# Patient Record
Sex: Female | Born: 1975 | Race: White | Hispanic: No | Smoking: Never smoker
Health system: Southern US, Community
[De-identification: ages and names within clinical notes are randomized; demographics above are authoritative.]

## PROBLEM LIST (undated history)

## (undated) DIAGNOSIS — F32A Depression, unspecified: Secondary | ICD-10-CM

## (undated) DIAGNOSIS — F419 Anxiety disorder, unspecified: Secondary | ICD-10-CM

## (undated) DIAGNOSIS — Z90710 Acquired absence of both cervix and uterus: Secondary | ICD-10-CM

## (undated) DIAGNOSIS — R9389 Abnormal findings on diagnostic imaging of other specified body structures: Secondary | ICD-10-CM

## (undated) DIAGNOSIS — R5383 Other fatigue: Secondary | ICD-10-CM

## (undated) DIAGNOSIS — E669 Obesity, unspecified: Secondary | ICD-10-CM

## (undated) DIAGNOSIS — N939 Abnormal uterine and vaginal bleeding, unspecified: Secondary | ICD-10-CM

## (undated) DIAGNOSIS — G4700 Insomnia, unspecified: Secondary | ICD-10-CM

## (undated) DIAGNOSIS — F909 Attention-deficit hyperactivity disorder, unspecified type: Secondary | ICD-10-CM

## (undated) DIAGNOSIS — Z9189 Other specified personal risk factors, not elsewhere classified: Secondary | ICD-10-CM

## (undated) DIAGNOSIS — I10 Essential (primary) hypertension: Secondary | ICD-10-CM

## (undated) DIAGNOSIS — K760 Fatty (change of) liver, not elsewhere classified: Secondary | ICD-10-CM

## (undated) DIAGNOSIS — E119 Type 2 diabetes mellitus without complications: Secondary | ICD-10-CM

## (undated) DIAGNOSIS — U071 COVID-19: Secondary | ICD-10-CM

## (undated) DIAGNOSIS — R7989 Other specified abnormal findings of blood chemistry: Secondary | ICD-10-CM

## (undated) DIAGNOSIS — F3289 Other specified depressive episodes: Secondary | ICD-10-CM

## (undated) DIAGNOSIS — N8500 Endometrial hyperplasia, unspecified: Secondary | ICD-10-CM

## (undated) DIAGNOSIS — Z6841 Body Mass Index (BMI) 40.0 and over, adult: Secondary | ICD-10-CM

## (undated) DIAGNOSIS — T7431XA Adult psychological abuse, confirmed, initial encounter: Secondary | ICD-10-CM

## (undated) HISTORY — DX: Endometrial hyperplasia, unspecified: N85.00

## (undated) HISTORY — DX: Type 2 diabetes mellitus without complications: E11.9

## (undated) HISTORY — DX: Abnormal findings on diagnostic imaging of other specified body structures: R93.89

## (undated) HISTORY — DX: Depression, unspecified: F32.A

## (undated) HISTORY — DX: Anxiety disorder, unspecified: F41.9

## (undated) HISTORY — DX: Insomnia, unspecified: G47.00

## (undated) HISTORY — DX: Other specified depressive episodes: F32.89

## (undated) HISTORY — DX: Acquired absence of both cervix and uterus: Z90.710

## (undated) HISTORY — DX: Other fatigue: R53.83

## (undated) HISTORY — DX: Attention-deficit hyperactivity disorder, unspecified type: F90.9

## (undated) HISTORY — DX: Fatty (change of) liver, not elsewhere classified: K76.0

## (undated) HISTORY — DX: COVID-19: U07.1

## (undated) HISTORY — DX: Other specified abnormal findings of blood chemistry: R79.89

## (undated) HISTORY — DX: Other specified personal risk factors, not elsewhere classified: Z91.89

## (undated) HISTORY — PX: LAPAROTOMY: SHX154

## (undated) HISTORY — DX: Adult psychological abuse, confirmed, initial encounter: T74.31XA

## (undated) HISTORY — DX: Essential (primary) hypertension: I10

## (undated) HISTORY — DX: Abnormal uterine and vaginal bleeding, unspecified: N93.9

## (undated) HISTORY — DX: Body Mass Index (BMI) 40.0 and over, adult: Z684

## (undated) HISTORY — DX: Obesity, unspecified: E66.9

---

## 2018-04-07 ENCOUNTER — Other Ambulatory Visit: Payer: Self-pay | Admitting: Licensed Clinical Social Worker

## 2018-04-10 ENCOUNTER — Other Ambulatory Visit: Payer: Self-pay | Admitting: Licensed Clinical Social Worker

## 2018-04-25 ENCOUNTER — Ambulatory Visit: Payer: Self-pay | Admitting: Primary Care

## 2018-05-08 ENCOUNTER — Encounter: Payer: Self-pay | Admitting: Family Medicine

## 2018-05-08 NOTE — Progress Notes (Signed)
Patient did not keep appointment today. She may call to reschedule.  

## 2019-05-30 ENCOUNTER — Other Ambulatory Visit: Payer: Self-pay

## 2019-05-30 DIAGNOSIS — Z20822 Contact with and (suspected) exposure to covid-19: Secondary | ICD-10-CM

## 2019-05-31 LAB — NOVEL CORONAVIRUS, NAA: SARS-CoV-2, NAA: NOT DETECTED

## 2019-06-01 ENCOUNTER — Other Ambulatory Visit: Payer: Self-pay

## 2019-06-01 ENCOUNTER — Ambulatory Visit (HOSPITAL_COMMUNITY)
Admission: EM | Admit: 2019-06-01 | Discharge: 2019-06-01 | Disposition: A | Discharge status: Home / Self Care | Payer: 59 | Attending: Family Medicine | Admitting: Family Medicine

## 2019-06-01 ENCOUNTER — Encounter (HOSPITAL_COMMUNITY): Payer: Self-pay

## 2019-06-01 DIAGNOSIS — J069 Acute upper respiratory infection, unspecified: Secondary | ICD-10-CM | POA: Diagnosis not present

## 2019-06-01 DIAGNOSIS — Z20828 Contact with and (suspected) exposure to other viral communicable diseases: Secondary | ICD-10-CM | POA: Insufficient documentation

## 2019-06-01 DIAGNOSIS — Z20822 Contact with and (suspected) exposure to covid-19: Secondary | ICD-10-CM

## 2019-06-01 MED ORDER — ONDANSETRON 4 MG PO TBDP: 4.0000 mg | ORAL_TABLET | Freq: Three times a day (TID) | ORAL | 0 refills | Status: DC | PRN

## 2019-06-01 MED ORDER — ALBUTEROL SULFATE HFA 108 (90 BASE) MCG/ACT IN AERS: 1.0000 | INHALATION_SPRAY | Freq: Four times a day (QID) | RESPIRATORY_TRACT | 0 refills | Status: DC | PRN

## 2019-06-01 NOTE — ED Triage Notes (Addendum)
Pt reports she was exposed 1 week ago to [employer]. Pt was tested for Covid 2 days ago and the negative result came back last night. Pt states having low grade fever x 3 days: nasal congestion, cough, sore throat, chills and nausea x 4 days.  Pt reports headache on and off x 4 days. Pt reports she had diarrhea 2 days ago.

## 2019-06-01 NOTE — ED Provider Notes (Signed)
[facility]    CSN: [id number] Arrival date & time: 06/01/19  0263      History   Chief Complaint Chief Complaint  Patient presents with  . Nasal Congestion  . Fever  . Sore Throat  . Cough  . Chills  . Headache  . Nausea    HPI Gloria Hardy is a 43 y.o. female.   Gloria Hardy presents with complaints of nausea, diarrhea, headache, some shortness of breath.  A coworker was ill 10/31 and developed a fever. 11/2 this coworker tested positive for covid-19. Started to feel unwell herself on 11/2 and was tested herself for covid-19. Results yesterday were negative. No asthma history. Uses a peppermint oil inhaler which does help. Nasal drainage, cough. Some muscle aches, such as to back, shoulders and neck. Some symptoms have improved, but some have worsened. No further diarrhea but stomach doesn't feel well. Nothing has helped the headache, although it has waxed and waned. Temp yesterday of 100.7. temp of 100.1 this morning.  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 Nausea, no vomiting. Eating and drinking but decreased intake. Decreased taste? No loss of smell. No other medications for symptoms. Chest tight, but no chest pain or difficulty breathing. Cough is dry.     ROS per HPI, negative if not otherwise mentioned.      History reviewed. No pertinent past medical history.  There are no active problems to display for this patient.   History reviewed. No pertinent surgical history.  OB History    Gravida  5   Para  3   Term  2   Preterm  1   AB  2   Living  3     SAB      TAB      Ectopic      Multiple      Live Births               Home Medications    Prior to Admission medications   Medication Sig Start Date End Date Taking? Authorizing Provider  Biotin 1000 MCG tablet Take 1,000 mcg by mouth 3 (three) times daily.   Yes [provider]  magnesium 30 MG tablet Take 30 mg by mouth 2 (two) times daily.   Yes [provider]  Multiple Vitamin (MULTIVITAMIN) tablet Take by mouth daily.   Yes [provider]  albuterol (PROAIR HFA) 108 (90 Base) MCG/ACT inhaler Inhale 1-2 puffs into the lungs every 6 (six) hours as needed for wheezing or shortness of breath. 06/01/19   Georgetta Haber, NP  ondansetron (ZOFRAN-ODT) 4 MG disintegrating tablet Take 1 tablet (4 mg total) by mouth every 8 (eight) hours as needed for nausea or vomiting. 06/01/19   Georgetta Haber, NP    Family History Family History  Problem Relation Age of Onset  . Healthy Mother   . Healthy Father     Social History Social History   Tobacco Use  . Smoking status: Never Smoker  . Smokeless tobacco: Never Used  Substance Use Topics  . Alcohol use: Yes  . Drug use: Never     Allergies   Patient has no known allergies.   Review of Systems Review of Systems   Physical Exam Triage Vital Signs ED Triage Vitals  Enc Vitals Group     BP 06/01/19 0955 140/80     Pulse Rate 06/01/19 0955 (!) 104     Resp 06/01/19 0955 18  Temp 06/01/19 0955 98.4 F (36.9 C)     Temp Source 06/01/19 0955 Oral     SpO2 06/01/19 0955 99 %     Weight --      Height --      Head Circumference --      Peak Flow --      Pain Score 06/01/19 0951 3     Pain Loc --      Pain Edu? --      Excl. in [place]? --    No data found.  Updated Vital Signs BP 140/80 (BP Location: Right Arm)   Pulse (!) 104   Temp 98.4 F (36.9 C) (Oral)   Resp 18   LMP 05/27/2019 (Exact Date)   SpO2 99%    Physical Exam Constitutional:      General: She is not in acute distress.    Appearance: She is well-developed.  Cardiovascular:     Rate and Rhythm: Tachycardia present.  Pulmonary:     Effort: Pulmonary effort is normal.  Skin:    General: Skin is warm and dry.  Neurological:     Mental Status: She is alert and oriented to person, place, and time.      UC Treatments / Results  Labs (all labs ordered are listed, but only abnormal  results are displayed) Labs Reviewed  NOVEL CORONAVIRUS, NAA (HOSP ORDER, SEND-OUT TO REF LAB; TAT 18-24 HRS)    EKG   Radiology No results found.  Procedures Procedures (including critical care time)  Medications Ordered in UC Medications - No data to display  Initial Impression / Assessment and Plan / UC Course  I have reviewed the triage vital signs and the nursing notes.  Pertinent labs & imaging results that were available during my care of the patient were reviewed by me and considered in my medical decision making (see chart for details).     Non toxic. Benign physical exam.  No increased work of breathing. No hypoxia. Mild tachycardia. Known covid exposure with symptoms consistent with the virus. High suspicion at this time, with possible false negative testing. Opted to repeat testing at this time with isolation precautions discussed and recommended at this time. Supportive cares recommended and discussed. Return precautions provided. Patient verbalized understanding and agreeable to plan.   Final Clinical Impressions(s) / UC Diagnoses   Final diagnoses:  Exposure to COVID-19 virus  Viral URI with cough     Discharge Instructions     Push fluids to ensure adequate hydration and keep secretions thin.  Tylenol and/or ibuprofen as needed for pain or fevers.  May use inhaler or zofran as needed for symptoms.  Self isolate until covid results are back and negative.  Will notify you by phone of any positive findings. Your negative results will be sent through your MyChart.     You may try some vitamins to help your immune system potentially:  Vitamin C 500mg  twice a day. Zing 50mg  daily. Vitamin D 5000IU daily.      ED Prescriptions    Medication Sig Dispense Auth. Provider   albuterol (PROAIR HFA) 108 (90 Base) MCG/ACT inhaler Inhale 1-2 puffs into the lungs every 6 (six) hours as needed for wheezing or shortness of breath. 8 g Augusto Gamble B, NP   ondansetron  (ZOFRAN-ODT) 4 MG disintegrating tablet Take 1 tablet (4 mg total) by mouth every 8 (eight) hours as needed for nausea or vomiting. 12 tablet Zigmund Gottron, NP  PDMP not reviewed this encounter.   Georgetta HaberBurky,  B, NP 06/01/19 1048

## 2019-06-01 NOTE — Discharge Instructions (Signed)
Push fluids to ensure adequate hydration and keep secretions thin.  Tylenol and/or ibuprofen as needed for pain or fevers.  May use inhaler or zofran as needed for symptoms.  Self isolate until covid results are back and negative.  Will notify you by phone of any positive findings. Your negative results will be sent through your MyChart.     You may try some vitamins to help your immune system potentially:  Vitamin C 500mg  twice a day. Zing 50mg  daily. Vitamin D 5000IU daily.

## 2019-06-03 LAB — NOVEL CORONAVIRUS, NAA (HOSP ORDER, SEND-OUT TO REF LAB; TAT 18-24 HRS): SARS-CoV-2, NAA: NOT DETECTED

## 2019-11-29 ENCOUNTER — Other Ambulatory Visit: Payer: Self-pay

## 2019-11-29 ENCOUNTER — Encounter (HOSPITAL_COMMUNITY): Payer: Self-pay

## 2019-11-29 ENCOUNTER — Ambulatory Visit (HOSPITAL_COMMUNITY)
Admission: EM | Admit: 2019-11-29 | Discharge: 2019-11-29 | Disposition: A | Discharge status: Home / Self Care | Payer: Managed Care, Other (non HMO) | Attending: Emergency Medicine | Admitting: Emergency Medicine

## 2019-11-29 DIAGNOSIS — H538 Other visual disturbances: Secondary | ICD-10-CM | POA: Diagnosis not present

## 2019-11-29 DIAGNOSIS — I10 Essential (primary) hypertension: Secondary | ICD-10-CM | POA: Diagnosis not present

## 2019-11-29 DIAGNOSIS — H00011 Hordeolum externum right upper eyelid: Secondary | ICD-10-CM | POA: Insufficient documentation

## 2019-11-29 LAB — POCT URINALYSIS DIP (DEVICE)
Bilirubin Urine: NEGATIVE
Glucose, UA: NEGATIVE mg/dL
Ketones, ur: NEGATIVE mg/dL
Leukocytes,Ua: NEGATIVE
Nitrite: NEGATIVE
Protein, ur: NEGATIVE mg/dL
Specific Gravity, Urine: >=1.03 (ref 1.005–1.030)
Urobilinogen, UA: 0.2 mg/dL (ref 0.0–1.0)
pH: 5.5 (ref 5.0–8.0)

## 2019-11-29 LAB — CBG MONITORING, ED: Glucose-Capillary: 88 mg/dL (ref 70–99)

## 2019-11-29 MED ORDER — AMLODIPINE BESYLATE 5 MG PO TABS
5.0000 mg | ORAL_TABLET | Freq: Every day | ORAL | 1 refills | Status: DC
Start: 2019-11-29 — End: 2020-01-18

## 2019-11-29 MED ORDER — CEPHALEXIN 500 MG PO CAPS
500.0000 mg | ORAL_CAPSULE | Freq: Four times a day (QID) | ORAL | 0 refills | Status: DC
Start: 2019-11-29 — End: 2020-01-18

## 2019-11-29 NOTE — Discharge Instructions (Addendum)
You will need to follow up with a pcp for new onset of hypertension.  Your blood pressure is high you will need to monitor daily the medication can cause you to become tired at times.  If you begin to have chest pain, shortness of breath you will need to go to the ER .  Stay hydrated with clear fluids  Cautious with going from sitting to standing  You may need to follow up with optmology if the stye continues

## 2019-11-29 NOTE — ED Provider Notes (Signed)
[place]    CSN: [id number] Arrival date & time: 11/29/19  1655      History   Chief Complaint Chief Complaint  Patient presents with  . Blurred Vision    HPI Gloria Hardy is a 44 y.o. female.   Pt is here for blurred vision intermit for the past few days worse while in work. States that she has also had a Stye to bil eye lids for approx 1 month. States that she has been taking advil cold and sinus intermit last one yesterday thinking her eyes was irritated from allergies with no relief. Denies any headache, no chest pain, no sob. Alert x4. Does have frequency with no urinary sx. Asked pt about hx of diabetes states that she did have gestational dm. Does not have a pcp at this time , has not seen one in a few years.      History reviewed. No pertinent past medical history.  There are no problems to display for this patient.   History reviewed. No pertinent surgical history.  OB History    Gravida  5   Para  3   Term  2   Preterm  1   AB  2   Living  3     SAB      TAB      Ectopic      Multiple      Live Births               Home Medications    Prior to Admission medications   Medication Sig Start Date End Date Taking? Authorizing Provider  albuterol (PROAIR HFA) 108 (90 Base) MCG/ACT inhaler Inhale 1-2 puffs into the lungs every 6 (six) hours as needed for wheezing or shortness of breath. 06/01/19   Zigmund Gottron, NP  amLODipine (NORVASC) 5 MG tablet Take 1 tablet (5 mg total) by mouth daily. 11/29/19   Marney Setting, NP  Biotin 1000 MCG tablet Take 1,000 mcg by mouth 3 (three) times daily.    [provider]  cephALEXin (KEFLEX) 500 MG capsule Take 1 capsule (500 mg total) by mouth 4 (four) times daily. 11/29/19   Marney Setting, NP  magnesium 30 MG tablet Take 30 mg by mouth 2 (two) times daily.    [provider]  Multiple Vitamin (MULTIVITAMIN) tablet Take by mouth daily.    [provider]   ondansetron (ZOFRAN-ODT) 4 MG disintegrating tablet Take 1 tablet (4 mg total) by mouth every 8 (eight) hours as needed for nausea or vomiting. 06/01/19   Zigmund Gottron, NP    Family History Family History  Problem Relation Age of Onset  . Healthy Mother   . Healthy Father     Social History Social History   Tobacco Use  . Smoking status: Never Smoker  . Smokeless tobacco: Never Used  Substance Use Topics  . Alcohol use: Yes  . Drug use: Never     Allergies   Patient has no known allergies.   Review of Systems Review of Systems  Constitutional: Negative.   HENT: Negative.   Eyes: Positive for visual disturbance.  Respiratory: Negative.   Cardiovascular: Negative.   Gastrointestinal: Negative.   Genitourinary: Positive for frequency.  Neurological: Negative.      Physical Exam Triage Vital Signs ED Triage Vitals  Enc Vitals Group     BP 11/29/19 1714 (!) 153/103     Pulse Rate 11/29/19 1714 98  Resp 11/29/19 1714 19     Temp 11/29/19 1714 98.9 F (37.2 C)     Temp Source 11/29/19 1714 Oral     SpO2 11/29/19 1714 100 %     Weight 11/29/19 1715 300 lb (136.1 kg)     Height --      Head Circumference --      Peak Flow --      Pain Score 11/29/19 1713 0     Pain Loc --      Pain Edu? --      Excl. in [place]? --    Orthostatic VS for the past 24 hrs:  BP- Lying Pulse- Lying BP- Sitting Pulse- Sitting BP- Standing at 0 minutes Pulse- Standing at 0 minutes  11/29/19 1756 (!) 152/92 95 (!) 162/113 101 (!) 169/127 101    Updated Vital Signs BP (!) 153/103 (BP Location: Right Arm)   Pulse 98   Temp 98.9 F (37.2 C) (Oral)   Resp 19   Wt 300 lb (136.1 kg)   LMP 11/23/2019   SpO2 100%   Visual Acuity Right Eye Distance: 20/30 Left Eye Distance: 20/40 Bilateral Distance: 20/30  Right Eye Near:   Left Eye Near:    Bilateral Near:     Physical Exam Constitutional:      Appearance: She is obese.  HENT:     Right Ear: Tympanic membrane normal.      Left Ear: Tympanic membrane normal.     Nose: Nose normal.     Mouth/Throat:     Mouth: Mucous membranes are moist.  Eyes:     Pupils: Pupils are equal, round, and reactive to light.     Comments: On bil upper eye lids small area of harden hordeolum noted non painful, slight erythema ,   Cardiovascular:     Rate and Rhythm: Normal rate.  Pulmonary:     Effort: Pulmonary effort is normal.  Abdominal:     General: Abdomen is flat.  Musculoskeletal:     Cervical back: Normal range of motion.  Skin:    General: Skin is warm.     Capillary Refill: Capillary refill takes less than 2 seconds.  Neurological:     General: No focal deficit present.     Mental Status: She is alert.      UC Treatments / Results  Labs (all labs ordered are listed, but only abnormal results are displayed) Labs Reviewed  POCT URINALYSIS DIP (DEVICE) - Abnormal; Notable for the following components:      Result Value   Hgb urine dipstick TRACE (*)    All other components within normal limits  URINE CULTURE  CBG MONITORING, ED    EKG   Radiology No results found.  Procedures Procedures (including critical care time)  Medications Ordered in UC Medications - No data to display  Initial Impression / Assessment and Plan / UC Course  I have reviewed the triage vital signs and the nursing notes.  Pertinent labs & imaging results that were available during my care of the patient were reviewed by me and considered in my medical decision making (see chart for details).     You will need to follow up with a pcp for new onset of hypertension.  Your blood pressure is high you will need to monitor daily the medication can cause you to become tired at times.  If you begin to have chest pain, shortness of breath you will need to go to the ER .  Stay hydrated with clear fluids avoid salt  Cautious with going from sitting to standing    Final Clinical Impressions(s) / UC Diagnoses   Final diagnoses:   Essential hypertension  Blurred vision  Hordeolum externum of right upper eyelid     Discharge Instructions     You will need to follow up with a pcp for new onset of hypertension.  Your blood pressure is high you will need to monitor daily the medication can cause you to become tired at times.  If you begin to have chest pain, shortness of breath you will need to go to the ER .  Stay hydrated with clear fluids  Cautious with going from sitting to standing  You may need to follow up with optmology if the stye continues      ED Prescriptions    Medication Sig Dispense Auth. Provider   amLODipine (NORVASC) 5 MG tablet Take 1 tablet (5 mg total) by mouth daily. 30 tablet Maple Mirza L, NP   cephALEXin (KEFLEX) 500 MG capsule Take 1 capsule (500 mg total) by mouth 4 (four) times daily. 20 capsule Coralyn Mark, NP     PDMP not reviewed this encounter.   Coralyn Mark, NP 11/29/19 1839

## 2019-11-29 NOTE — ED Notes (Signed)
Pt reported mild dizziness 2/10 scale subjectively from lying to sitting position during ortho v/s.

## 2019-11-29 NOTE — ED Notes (Signed)
CBG completed 88; last po meal was approx 3 hours PTA

## 2019-11-29 NOTE — ED Triage Notes (Signed)
Pt is here with blurred vision and watery eyes that started yesterday. Pt has not taken to relieve discomfort.

## 2019-11-30 LAB — URINE CULTURE: Culture: 10000 — AB

## 2020-01-18 ENCOUNTER — Other Ambulatory Visit: Payer: Self-pay

## 2020-01-18 ENCOUNTER — Encounter (HOSPITAL_COMMUNITY): Payer: Self-pay | Admitting: Emergency Medicine

## 2020-01-18 ENCOUNTER — Ambulatory Visit (HOSPITAL_COMMUNITY)
Admission: EM | Admit: 2020-01-18 | Discharge: 2020-01-18 | Disposition: A | Discharge status: Home / Self Care | Payer: 59 | Attending: Internal Medicine | Admitting: Internal Medicine

## 2020-01-18 DIAGNOSIS — I10 Essential (primary) hypertension: Secondary | ICD-10-CM | POA: Diagnosis not present

## 2020-01-18 DIAGNOSIS — Z791 Long term (current) use of non-steroidal anti-inflammatories (NSAID): Secondary | ICD-10-CM | POA: Insufficient documentation

## 2020-01-18 DIAGNOSIS — R519 Headache, unspecified: Secondary | ICD-10-CM

## 2020-01-18 DIAGNOSIS — Z79899 Other long term (current) drug therapy: Secondary | ICD-10-CM | POA: Insufficient documentation

## 2020-01-18 DIAGNOSIS — R110 Nausea: Secondary | ICD-10-CM | POA: Insufficient documentation

## 2020-01-18 DIAGNOSIS — B349 Viral infection, unspecified: Secondary | ICD-10-CM

## 2020-01-18 DIAGNOSIS — Z20822 Contact with and (suspected) exposure to covid-19: Secondary | ICD-10-CM | POA: Diagnosis not present

## 2020-01-18 HISTORY — DX: Essential (primary) hypertension: I10

## 2020-01-18 LAB — CBG MONITORING, ED: Glucose-Capillary: 87 mg/dL (ref 70–99)

## 2020-01-18 MED ORDER — NAPROXEN 500 MG PO TABS
500.0000 mg | ORAL_TABLET | Freq: Two times a day (BID) | ORAL | 0 refills | Status: DC
Start: 2020-01-18 — End: 2021-12-28

## 2020-01-18 MED ORDER — ONDANSETRON 4 MG PO TBDP
4.0000 mg | ORAL_TABLET | Freq: Three times a day (TID) | ORAL | 0 refills | Status: DC | PRN
Start: 2020-01-18 — End: 2021-12-28

## 2020-01-18 MED ORDER — AMLODIPINE BESYLATE 5 MG PO TABS
5.0000 mg | ORAL_TABLET | Freq: Every day | ORAL | 0 refills | Status: DC
Start: 2020-01-18 — End: 2021-03-03

## 2020-01-18 MED ORDER — BENZONATATE 200 MG PO CAPS: 200.0000 mg | ORAL_CAPSULE | Freq: Three times a day (TID) | ORAL | 0 refills | Status: AC | PRN

## 2020-01-18 NOTE — ED Provider Notes (Signed)
[facility]    CSN: [id number] Arrival date & time: 01/18/20  1830      History   Chief Complaint Chief Complaint  Patient presents with  . Generalized Body Aches    HPI Gloria Hardy is a 44 y.o. female history of hypertension presenting today for evaluation of fever, body aches and headache.  Patient reports today she began to feel feverish up to 100.3.  She has had a headache in her posterior head which is radiated upward at times.  Has waxed and waned throughout the day.  Has had associated nausea at times.  Also reports some congestion and cough over the past 48 hours.  Denies chest pain or shortness of breath.  Denies vision changes.  Has had history of migraines, but has not been as frequent of recently.  She also reports some periodic episodes of feeling very shaky.  Denies at current.  Denies history of diabetes.  Recently was started on blood pressure medicine approximately 2 months ago, but has been using sparingly as she has not been able to follow-up with primary care until next month.  Denies close sick contacts.  HPI  Past Medical History:  Diagnosis Date  . Hypertension     There are no problems to display for this patient.   History reviewed. No pertinent surgical history.  OB History    Gravida  5   Para  3   Term  2   Preterm  1   AB  2   Living  3     SAB      TAB      Ectopic      Multiple      Live Births               Home Medications    Prior to Admission medications   Medication Sig Start Date End Date Taking? Authorizing Provider  albuterol (PROAIR HFA) 108 (90 Base) MCG/ACT inhaler Inhale 1-2 puffs into the lungs every 6 (six) hours as needed for wheezing or shortness of breath. 06/01/19   Georgetta Haber, NP  amLODipine (NORVASC) 5 MG tablet Take 1 tablet (5 mg total) by mouth daily. 01/18/20   Aftyn Nott C, PA-C  benzonatate (TESSALON) 200 MG capsule Take 1 capsule (200 mg total) by mouth 3 (three) times  daily as needed for up to 7 days for cough. 01/18/20 01/25/20  Ranee Peasley C, PA-C  Biotin 1000 MCG tablet Take 1,000 mcg by mouth 3 (three) times daily.    [provider]  magnesium 30 MG tablet Take 30 mg by mouth 2 (two) times daily.    [provider]  Multiple Vitamin (MULTIVITAMIN) tablet Take by mouth daily.    [provider]  naproxen (NAPROSYN) 500 MG tablet Take 1 tablet (500 mg total) by mouth 2 (two) times daily. 01/18/20   Lane Eland C, PA-C  ondansetron (ZOFRAN ODT) 4 MG disintegrating tablet Take 1 tablet (4 mg total) by mouth every 8 (eight) hours as needed for nausea or vomiting. 01/18/20   Cristan Scherzer, Junius Creamer, PA-C    Family History Family History  Problem Relation Age of Onset  . Healthy Mother   . Healthy Father     Social History Social History   Tobacco Use  . Smoking status: Never Smoker  . Smokeless tobacco: Never Used  Substance Use Topics  . Alcohol use: Yes  . Drug use: Never     Allergies   Patient  has no known allergies.   Review of Systems Review of Systems  Constitutional: Positive for fatigue. Negative for activity change, appetite change, chills and fever.  HENT: Positive for congestion. Negative for ear pain, rhinorrhea, sinus pressure, sore throat and trouble swallowing.   Eyes: Negative for photophobia, pain, discharge, redness and visual disturbance.  Respiratory: Positive for cough. Negative for chest tightness and shortness of breath.   Cardiovascular: Negative for chest pain.  Gastrointestinal: Positive for nausea. Negative for abdominal pain, diarrhea and vomiting.  Genitourinary: Negative for decreased urine volume and hematuria.  Musculoskeletal: Negative for myalgias, neck pain and neck stiffness.  Skin: Negative for rash.  Neurological: Positive for headaches. Negative for dizziness, syncope, facial asymmetry, speech difficulty, weakness, light-headedness and numbness.     Physical Exam Triage  Vital Signs ED Triage Vitals  Enc Vitals Group     BP 01/18/20 1909 (!) 191/96     Pulse Rate 01/18/20 1909 (!) 103     Resp 01/18/20 1909 18     Temp 01/18/20 1909 98.4 F (36.9 C)     Temp Source 01/18/20 1909 Oral     SpO2 01/18/20 1909 99 %     Weight --      Height --      Head Circumference --      Peak Flow --      Pain Score 01/18/20 1910 5     Pain Loc --      Pain Edu? --      Excl. in [place]? --    No data found.  Updated Vital Signs BP (!) 148/83 (BP Location: Right Arm)   Pulse (!) 103   Temp 98.4 F (36.9 C) (Oral)   Resp 18   SpO2 99%   Visual Acuity Right Eye Distance:   Left Eye Distance:   Bilateral Distance:    Right Eye Near:   Left Eye Near:    Bilateral Near:     Physical Exam Vitals and nursing note reviewed.  Constitutional:      Appearance: She is well-developed.     Comments: No acute distress  HENT:     Head: Normocephalic and atraumatic.     Ears:     Comments: Bilateral ears without tenderness to palpation of external auricle, tragus and mastoid, EAC's without erythema or swelling, TM's with good bony landmarks and cone of light. Non erythematous.     Nose: Nose normal.     Mouth/Throat:     Comments: Oral mucosa pink and moist, no tonsillar enlargement or exudate. Posterior pharynx patent and nonerythematous, no uvula deviation or swelling. Normal phonation. Eyes:     Extraocular Movements: Extraocular movements intact.     Conjunctiva/sclera: Conjunctivae normal.     Pupils: Pupils are equal, round, and reactive to light.  Cardiovascular:     Rate and Rhythm: Normal rate.     Comments: No carotid bruits auscultated Pulmonary:     Effort: Pulmonary effort is normal. No respiratory distress.     Comments: Breathing comfortably at rest, CTABL, no wheezing, rales or other adventitious sounds auscultated Abdominal:     General: There is no distension.  Musculoskeletal:        General: Normal range of motion.     Cervical back:  Neck supple.  Skin:    General: Skin is warm and dry.  Neurological:     General: No focal deficit present.     Mental Status: She is alert and oriented to person,  place, and time. Mental status is at baseline.     Cranial Nerves: No cranial nerve deficit.     Motor: No weakness.     Gait: Gait normal.      UC Treatments / Results  Labs (all labs ordered are listed, but only abnormal results are displayed) Labs Reviewed  SARS CORONAVIRUS 2 (TAT 6-24 HRS)  CBG MONITORING, ED    EKG   Radiology No results found.  Procedures Procedures (including critical care time)  Medications Ordered in UC Medications - No data to display  Initial Impression / Assessment and Plan / UC Course  I have reviewed the triage vital signs and the nursing notes.  Pertinent labs & imaging results that were available during my care of the patient were reviewed by me and considered in my medical decision making (see chart for details).     Blood pressure much improved on recheck.  Covid PCR pending to screen for potential viral illness contributing to symptoms versus headache/stress versus related to blood pressure.  Refilling amlodipine to hold patient over until she follows up with PCP next month.  Naprosyn for headache, Zofran as needed for nausea.  Rest and fluids.  No red flags for headache at this point.  Discussed warning signs to follow-up in ED. Discussed strict return precautions. Patient verbalized understanding and is agreeable with plan.   Final Clinical Impressions(s) / UC Diagnoses   Final diagnoses:  Acute nonintractable headache, unspecified headache type  Viral illness     Discharge Instructions     Your blood pressure was elevated today in clinic. Please be sure to take blood pressure medications as prescribed. Please monitor your blood pressure at home or when you go to a [facility]. Please follow up with your primary care doctor to recheck blood pressure and discuss  any need for medication changes.   Please go to Emergency Room if you start to experience severe headache, vision changes, decreased urine production, chest pain, shortness of breath, speech slurring, one sided weakness.   Tessalon/benzonatate every 8 hours as needed for cough May use over the counter zyrtec, flonase, mucinex for congsetion Naprosyn twice dialy with food for headache Zofran for nausea  Follow up if any symptoms not improving or worsening   ED Prescriptions    Medication Sig Dispense Auth. Provider   amLODipine (NORVASC) 5 MG tablet Take 1 tablet (5 mg total) by mouth daily. 60 tablet Mykael Batz C, PA-C   benzonatate (TESSALON) 200 MG capsule Take 1 capsule (200 mg total) by mouth 3 (three) times daily as needed for up to 7 days for cough. 28 capsule Yesica Kemler C, PA-C   naproxen (NAPROSYN) 500 MG tablet Take 1 tablet (500 mg total) by mouth 2 (two) times daily. 30 tablet Patric Buckhalter C, PA-C   ondansetron (ZOFRAN ODT) 4 MG disintegrating tablet Take 1 tablet (4 mg total) by mouth every 8 (eight) hours as needed for nausea or vomiting. 20 tablet Nahshon Reich, [place] C, PA-C     PDMP not reviewed this encounter.   Janith Lima, [place] 01/18/20 2045

## 2020-01-18 NOTE — Discharge Instructions (Signed)
Your blood pressure was elevated today in clinic. Please be sure to take blood pressure medications as prescribed. Please monitor your blood pressure at home or when you go to a [facility]. Please follow up with your primary care doctor to recheck blood pressure and discuss any need for medication changes.   Please go to Emergency Room if you start to experience severe headache, vision changes, decreased urine production, chest pain, shortness of breath, speech slurring, one sided weakness.   Tessalon/benzonatate every 8 hours as needed for cough May use over the counter zyrtec, flonase, mucinex for congsetion Naprosyn twice dialy with food for headache Zofran for nausea  Follow up if any symptoms not improving or worsening

## 2020-01-18 NOTE — ED Triage Notes (Signed)
Pt sts body aches, HA, nausea and low grade fever starting today; pt sts taking BP meds but was trying to conserve them until her PCP appointment so not taking everyday

## 2020-01-19 LAB — SARS CORONAVIRUS 2 (TAT 6-24 HRS): SARS Coronavirus 2: NEGATIVE

## 2020-09-24 ENCOUNTER — Ambulatory Visit (HOSPITAL_COMMUNITY): Payer: Self-pay

## 2020-10-08 ENCOUNTER — Ambulatory Visit (HOSPITAL_COMMUNITY)
Admission: RE | Admit: 2020-10-08 | Discharge: 2020-10-08 | Disposition: A | Discharge status: Home / Self Care | Payer: No Typology Code available for payment source | Source: Ambulatory Visit | Attending: Physician Assistant | Admitting: Physician Assistant

## 2020-10-08 ENCOUNTER — Other Ambulatory Visit: Payer: Self-pay

## 2020-10-08 ENCOUNTER — Encounter (HOSPITAL_COMMUNITY): Payer: Self-pay

## 2020-10-08 VITALS — BP 167/109 | HR 88 | Temp 99.4°F | Resp 18

## 2020-10-08 DIAGNOSIS — J069 Acute upper respiratory infection, unspecified: Secondary | ICD-10-CM | POA: Diagnosis not present

## 2020-10-08 DIAGNOSIS — J029 Acute pharyngitis, unspecified: Secondary | ICD-10-CM | POA: Diagnosis not present

## 2020-10-08 DIAGNOSIS — Z79899 Other long term (current) drug therapy: Secondary | ICD-10-CM | POA: Diagnosis not present

## 2020-10-08 DIAGNOSIS — Z20822 Contact with and (suspected) exposure to covid-19: Secondary | ICD-10-CM | POA: Diagnosis not present

## 2020-10-08 MED ORDER — ALBUTEROL SULFATE HFA 108 (90 BASE) MCG/ACT IN AERS
2.0000 | INHALATION_SPRAY | RESPIRATORY_TRACT | 0 refills | Status: DC | PRN
Start: 2020-10-08 — End: 2021-12-28

## 2020-10-08 MED ORDER — BENZONATATE 100 MG PO CAPS
100.0000 mg | ORAL_CAPSULE | Freq: Four times a day (QID) | ORAL | 0 refills | Status: DC | PRN
Start: 2020-10-08 — End: 2021-03-03

## 2020-10-08 NOTE — ED Triage Notes (Signed)
Pt presents with sore throat and congestion X 2 days; Pt also needs medication refill on blood pressure medication.

## 2020-10-08 NOTE — Discharge Instructions (Addendum)
Return if any problems. Covid is pending 

## 2020-10-08 NOTE — ED Provider Notes (Signed)
[facility]    CSN: [id number] Arrival date & time: 10/08/20  1542      History   Chief Complaint Chief Complaint  Patient presents with  . APPOINTMENT : Sore Throat & Rx Refill    HPI Gloria Hardy is a 45 y.o. female.   The history is provided by the patient. No language interpreter was used.  Sore Throat This is a new problem. The current episode started 2 days ago. The problem occurs constantly. The problem has been gradually worsening. Pertinent negatives include no headaches. Nothing aggravates the symptoms. Nothing relieves the symptoms. She has tried nothing for the symptoms. The treatment provided no relief.  Pt complains of coughing and sore throat   Past Medical History:  Diagnosis Date  . Hypertension     There are no problems to display for this patient.   History reviewed. No pertinent surgical history.  OB History    Gravida  5   Para  3   Term  2   Preterm  1   AB  2   Living  3     SAB      IAB      Ectopic      Multiple      Live Births               Home Medications    Prior to Admission medications   Medication Sig Start Date End Date Taking? Authorizing Provider  benzonatate (TESSALON PERLES) 100 MG capsule Take 1 capsule (100 mg total) by mouth every 6 (six) hours as needed for cough. 10/08/20 10/08/21 Yes Elson Areas, PA-C  albuterol (PROAIR HFA) 108 (90 Base) MCG/ACT inhaler Inhale 2 puffs into the lungs every 4 (four) hours as needed for wheezing or shortness of breath. 10/08/20   Elson Areas, PA-C  amLODipine (NORVASC) 5 MG tablet Take 1 tablet (5 mg total) by mouth daily. 01/18/20   Wieters, Hallie C, PA-C  Biotin 1000 MCG tablet Take 1,000 mcg by mouth 3 (three) times daily.    [provider]  magnesium 30 MG tablet Take 30 mg by mouth 2 (two) times daily.    [provider]  Multiple Vitamin (MULTIVITAMIN) tablet Take by mouth daily.    [provider]  naproxen  (NAPROSYN) 500 MG tablet Take 1 tablet (500 mg total) by mouth 2 (two) times daily. 01/18/20   Wieters, Hallie C, PA-C  ondansetron (ZOFRAN ODT) 4 MG disintegrating tablet Take 1 tablet (4 mg total) by mouth every 8 (eight) hours as needed for nausea or vomiting. 01/18/20   Wieters, Junius Creamer, PA-C    Family History Family History  Problem Relation Age of Onset  . Healthy Mother   . Healthy Father     Social History Social History   Tobacco Use  . Smoking status: Never Smoker  . Smokeless tobacco: Never Used  Substance Use Topics  . Alcohol use: Yes  . Drug use: Never     Allergies   Patient has no known allergies.   Review of Systems Review of Systems  Neurological: Negative for headaches.  All other systems reviewed and are negative.    Physical Exam Triage Vital Signs ED Triage Vitals  Enc Vitals Group     BP 10/08/20 1606 (!) 167/109     Pulse Rate 10/08/20 1606 88     Resp 10/08/20 1606 18     Temp 10/08/20 1606 99.4 F (37.4 C)  Temp Source 10/08/20 1606 Oral     SpO2 10/08/20 1606 95 %     Weight --      Height --      Head Circumference --      Peak Flow --      Pain Score 10/08/20 1607 7     Pain Loc --      Pain Edu? --      Excl. in GC? --    No data found.  Updated Vital Signs BP (!) 167/109 (BP Location: Left Arm)   Pulse 88   Temp 99.4 F (37.4 C) (Oral)   Resp 18   SpO2 95%   Visual Acuity Right Eye Distance:   Left Eye Distance:   Bilateral Distance:    Right Eye Near:   Left Eye Near:    Bilateral Near:     Physical Exam Vitals and nursing note reviewed.  Constitutional:      Appearance: She is well-developed.  HENT:     Head: Normocephalic.  Cardiovascular:     Rate and Rhythm: Normal rate and regular rhythm.     Pulses: Normal pulses.  Pulmonary:     Effort: Pulmonary effort is normal.  Abdominal:     General: There is no distension.  Musculoskeletal:        General: Normal range of motion.     Cervical back:  Normal range of motion.  Skin:    General: Skin is warm.  Neurological:     General: No focal deficit present.     Mental Status: She is alert and oriented to person, place, and time.  Psychiatric:        Mood and Affect: Mood normal.      UC Treatments / Results  Labs (all labs ordered are listed, but only abnormal results are displayed) Labs Reviewed  SARS CORONAVIRUS 2 (TAT 6-24 HRS)    EKG   Radiology No results found.  Procedures Procedures (including critical care time)  Medications Ordered in UC Medications - No data to display  Initial Impression / Assessment and Plan / UC Course  I have reviewed the triage vital signs and the nursing notes.  Pertinent labs & imaging results that were available during my care of the patient were reviewed by me and considered in my medical decision making (see chart for details).     MDM:  covid pending. Pt given rx for albuterol and tessalon Pt advised warm salt water gargles and lozenges  Final Clinical Impressions(s) / UC Diagnoses   Final diagnoses:  Acute upper respiratory infection     Discharge Instructions     Return if any problems.  Covid is pending    ED Prescriptions    Medication Sig Dispense Auth. Provider   albuterol (PROAIR HFA) 108 (90 Base) MCG/ACT inhaler Inhale 2 puffs into the lungs every 4 (four) hours as needed for wheezing or shortness of breath. 8 g Glory Graefe K, PA-C   benzonatate (TESSALON PERLES) 100 MG capsule Take 1 capsule (100 mg total) by mouth every 6 (six) hours as needed for cough. 30 capsule Elson Areas, [place]     PDMP not reviewed this encounter.  An After Visit Summary was printed and given to the patient.    Elson Areas, [place] 10/08/20 1657

## 2020-10-09 LAB — SARS CORONAVIRUS 2 (TAT 6-24 HRS): SARS Coronavirus 2: NEGATIVE

## 2021-03-03 ENCOUNTER — Ambulatory Visit: Payer: Self-pay

## 2021-03-03 ENCOUNTER — Encounter (HOSPITAL_COMMUNITY): Payer: Self-pay | Admitting: Emergency Medicine

## 2021-03-03 ENCOUNTER — Ambulatory Visit (HOSPITAL_COMMUNITY)
Admission: EM | Admit: 2021-03-03 | Discharge: 2021-03-03 | Disposition: A | Discharge status: Home / Self Care | Payer: No Typology Code available for payment source | Attending: Family Medicine | Admitting: Family Medicine

## 2021-03-03 DIAGNOSIS — E669 Obesity, unspecified: Secondary | ICD-10-CM | POA: Insufficient documentation

## 2021-03-03 DIAGNOSIS — F419 Anxiety disorder, unspecified: Secondary | ICD-10-CM | POA: Insufficient documentation

## 2021-03-03 DIAGNOSIS — U071 COVID-19: Secondary | ICD-10-CM | POA: Insufficient documentation

## 2021-03-03 DIAGNOSIS — R030 Elevated blood-pressure reading, without diagnosis of hypertension: Secondary | ICD-10-CM | POA: Insufficient documentation

## 2021-03-03 DIAGNOSIS — J069 Acute upper respiratory infection, unspecified: Secondary | ICD-10-CM | POA: Diagnosis present

## 2021-03-03 LAB — SARS CORONAVIRUS 2 (TAT 6-24 HRS): SARS Coronavirus 2: POSITIVE — AB

## 2021-03-03 MED ORDER — HYDROXYZINE HCL 10 MG PO TABS
10.0000 mg | ORAL_TABLET | Freq: Three times a day (TID) | ORAL | 0 refills | Status: DC | PRN
Start: 2021-03-03 — End: 2021-12-28

## 2021-03-03 MED ORDER — AMLODIPINE BESYLATE 5 MG PO TABS
5.0000 mg | ORAL_TABLET | Freq: Every day | ORAL | 2 refills | Status: DC
Start: 2021-03-03 — End: 2021-12-28

## 2021-03-03 MED ORDER — PREDNISONE 20 MG PO TABS
40.0000 mg | ORAL_TABLET | Freq: Every day | ORAL | 0 refills | Status: DC
Start: 2021-03-03 — End: 2021-12-28

## 2021-03-03 MED ORDER — BENZONATATE 100 MG PO CAPS
200.0000 mg | ORAL_CAPSULE | Freq: Three times a day (TID) | ORAL | 0 refills | Status: DC | PRN
Start: 2021-03-03 — End: 2021-12-28

## 2021-03-03 NOTE — ED Provider Notes (Signed)
[facility]    CSN: [id number] Arrival date & time: 03/03/21  1046      History   Chief Complaint Chief Complaint  Patient presents with   Cough   Nasal Congestion   Otalgia   Fatigue   Chills   Anxiety    HPI Gloria Hardy is a 45 y.o. female.   Patient presenting today with 5-day history of cough, congestion, chills, body aches, fatigue, sore throat, headache.  Denies chest pain, shortness of breath, abdominal pain, nausea vomiting or diarrhea.  She states that her anxiety has been significant with frequent panic attacks since getting sick as her children are also sick and she is trying to work from home but unable to certain days because she is felt so bad.  Not currently followed by PCP so does not have anything for her anxiety at home, and also has run out of her blood pressure medication so her blood pressures have been elevated consistently.  She was previously taking amlodipine with good relief of her high blood pressures.  Has had Xanax in the past for her anxiety which typically did well.  Past Medical History:  Diagnosis Date   Hypertension    There are no problems to display for this patient.  History reviewed. No pertinent surgical history.  OB History     Gravida  5   Para  3   Term  2   Preterm  1   AB  2   Living  3      SAB      IAB      Ectopic      Multiple      Live Births             Home Medications    Prior to Admission medications   Medication Sig Start Date End Date Taking? Authorizing Provider  hydrOXYzine (ATARAX/VISTARIL) 10 MG tablet Take 1 tablet (10 mg total) by mouth 3 (three) times daily as needed. 03/03/21  Yes Particia Nearing, PA-C  predniSONE (DELTASONE) 20 MG tablet Take 2 tablets (40 mg total) by mouth daily with breakfast. 03/03/21  Yes Particia Nearing, PA-C  albuterol [facility] HFA) 108 (90 Base) MCG/ACT inhaler Inhale 2 puffs into the lungs every 4 (four) hours as needed for wheezing or  shortness of breath. 10/08/20   Elson Areas, PA-C  amLODipine (NORVASC) 5 MG tablet Take 1 tablet (5 mg total) by mouth daily. 03/03/21   Particia Nearing, PA-C  benzonatate (TESSALON PERLES) 100 MG capsule Take 2 capsules (200 mg total) by mouth 3 (three) times daily as needed for cough. 03/03/21 03/03/22  Particia Nearing, PA-C  Biotin 1000 MCG tablet Take 1,000 mcg by mouth 3 (three) times daily.    [provider]  magnesium 30 MG tablet Take 30 mg by mouth 2 (two) times daily.    [provider]  Multiple Vitamin (MULTIVITAMIN) tablet Take by mouth daily.    [provider]  naproxen (NAPROSYN) 500 MG tablet Take 1 tablet (500 mg total) by mouth 2 (two) times daily. 01/18/20   Wieters, Hallie C, PA-C  ondansetron (ZOFRAN ODT) 4 MG disintegrating tablet Take 1 tablet (4 mg total) by mouth every 8 (eight) hours as needed for nausea or vomiting. 01/18/20   Wieters, Junius Creamer, PA-C   Family History Family History  Problem Relation Age of Onset   Healthy Mother    Healthy Father    Social History Social  History   Tobacco Use   Smoking status: Never   Smokeless tobacco: Never  Substance Use Topics   Alcohol use: Yes   Drug use: Never     Allergies   Patient has no known allergies.   Review of Systems Review of Systems Per HPI  Physical Exam Triage Vital Signs ED Triage Vitals  Enc Vitals Group     BP 03/03/21 1125 (!) 165/112     Pulse Rate 03/03/21 1125 (!) 105     Resp 03/03/21 1125 18     Temp 03/03/21 1125 98.7 F (37.1 C)     Temp src --      SpO2 03/03/21 1125 99 %     Weight --      Height --      Head Circumference --      Peak Flow --      Pain Score 03/03/21 1129 7     Pain Loc --      Pain Edu? --      Excl. in GC? --    No data found.  Updated Vital Signs BP (!) 165/112 (BP Location: Left Arm)   Pulse (!) 105   Temp 98.7 F (37.1 C)   Resp 18   SpO2 99%   Visual Acuity Right Eye Distance:   Left Eye  Distance:   Bilateral Distance:    Right Eye Near:   Left Eye Near:    Bilateral Near:     Physical Exam Vitals and nursing note reviewed.  Constitutional:      Appearance: Normal appearance. She is not ill-appearing.  HENT:     Head: Atraumatic.     Right Ear: Tympanic membrane normal.     Left Ear: Tympanic membrane normal. There is no impacted cerumen.     Nose: Rhinorrhea present.     Mouth/Throat:     Mouth: Mucous membranes are moist.     Pharynx: Posterior oropharyngeal erythema present. No oropharyngeal exudate.  Eyes:     Extraocular Movements: Extraocular movements intact.     Conjunctiva/sclera: Conjunctivae normal.  Cardiovascular:     Rate and Rhythm: Normal rate and regular rhythm.     Heart sounds: Normal heart sounds.  Pulmonary:     Effort: Pulmonary effort is normal.     Breath sounds: Normal breath sounds. No wheezing or rales.  Abdominal:     General: Bowel sounds are normal. There is no distension.     Palpations: Abdomen is soft.     Tenderness: There is no abdominal tenderness. There is no right CVA tenderness, left CVA tenderness or guarding.  Musculoskeletal:        General: Normal range of motion.     Cervical back: Normal range of motion and neck supple.  Skin:    General: Skin is warm and dry.  Neurological:     Mental Status: She is alert and oriented to person, place, and time.  Psychiatric:        Mood and Affect: Mood normal.        Thought Content: Thought content normal.        Judgment: Judgment normal.   UC Treatments / Results  Labs (all labs ordered are listed, but only abnormal results are displayed) Labs Reviewed  SARS CORONAVIRUS 2 (TAT 6-24 HRS)   EKG   Radiology No results found.  Procedures Procedures (including critical care time)  Medications Ordered in UC Medications - No data to display  Initial Impression /  Assessment and Plan / UC Course  I have reviewed the triage vital signs and the nursing  notes.  Pertinent labs & imaging results that were available during my care of the patient were reviewed by me and considered in my medical decision making (see chart for details).     Suspect COVID causing the majority of her symptoms.  COVID PCR pending for confirmation, will treat with prednisone, Tessalon supportive home medications and home care.  We will restart her amlodipine while she works on getting in with a primary care provider and continue following home blood pressures closely.-Diet and stress reduction reviewed.  Hydroxyzine sent for as needed anxiety relief, behavioral health urgent care resources given in case worsening in the meantime.  Follow-up for worsening symptoms.  Final Clinical Impressions(s) / UC Diagnoses   Final diagnoses:  Viral URI with cough  Anxiety  Elevated blood pressure reading     Discharge Instructions      [facility] Address: [address] [place], [place] [address] Phone: [phone]     ED Prescriptions     Medication Sig Dispense Auth. Provider   benzonatate (TESSALON PERLES) 100 MG capsule Take 2 capsules (200 mg total) by mouth 3 (three) times daily as needed for cough. 30 capsule Particia Nearing, [place]   predniSONE (DELTASONE) 20 MG tablet Take 2 tablets (40 mg total) by mouth daily with breakfast. 10 tablet Particia Nearing, PA-C   amLODipine (NORVASC) 5 MG tablet Take 1 tablet (5 mg total) by mouth daily. 30 tablet Particia Nearing, [place]   hydrOXYzine (ATARAX/VISTARIL) 10 MG tablet Take 1 tablet (10 mg total) by mouth 3 (three) times daily as needed. 20 tablet Particia Nearing, [place]      PDMP not reviewed this encounter.   Particia Nearing, [place] 03/03/21 1556

## 2021-03-03 NOTE — ED Triage Notes (Signed)
Pt is present today with cough, chills, fatigue, chest congestion, sore throat, HA, and anxiety. Pt states that her sx started Friday. Pt has been exposed to Covid.

## 2021-03-03 NOTE — Discharge Instructions (Addendum)
[facility] °Address: [address], [place], [place] [address] °Phone: [phone] °

## 2021-05-30 ENCOUNTER — Other Ambulatory Visit: Payer: Self-pay | Admitting: Family Medicine

## 2021-12-09 ENCOUNTER — Other Ambulatory Visit: Payer: Self-pay | Admitting: Internal Medicine

## 2021-12-09 DIAGNOSIS — R748 Abnormal levels of other serum enzymes: Secondary | ICD-10-CM

## 2021-12-23 ENCOUNTER — Ambulatory Visit
Admission: RE | Admit: 2021-12-23 | Discharge: 2021-12-23 | Disposition: A | Discharge status: Home / Self Care | Payer: 59 | Source: Ambulatory Visit | Attending: Internal Medicine | Admitting: Internal Medicine

## 2021-12-23 DIAGNOSIS — R748 Abnormal levels of other serum enzymes: Secondary | ICD-10-CM

## 2021-12-23 NOTE — H&P (Addendum)
Gloria Hardy is an 46 y.o. 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  In regards to her AUB, patient has not had a period up until just prior to procedure since January. Prior to January, report very heavy menses. For the last 5 years, her periods have been unpredictable and heavy. Ultimately, desire definitive hysterectomy. She is working on weight loss and has lost 10lbs.  Patient was initially evaluated for ASC-H/HRHPV positive pap smear on 3/28. She has not had a pap smear in at least 13 year prior to this. Colposcopy on 4/17 revealed benign cervical biopsy at 12 o'clock and ECC revealed CIN III.   Work-up: TSH (12/04/21): 3.32 (WNL)  TVUS (12/23/21): Uterus 9.27 x 5.86 x 6.66cm Endometrial thickness 1.62cm Right ovary 3.32cm Anteverted uterus. No uterine anomalies seen. Endometrium thickened and echogenic. Nabothian cysts on the cervix. Right ovary wnl. Left ovary not visualized. No adnexal masses seen. Technically difficult study with limited visualization of pelvic anatomy due to body habitus.  Patient Active Problem List   Diagnosis Date Noted   Abnormal uterine bleeding 12/31/2021   Abnormal findings on diagnostic imaging of other specified body structures 12/31/2021   Insomnia 12/31/2021   Menstrual disorder 12/31/2021   Other specified abnormal findings of blood chemistry 12/31/2021   Other specified depressive episodes 12/31/2021   Other specified personal risk factors, not elsewhere classified 12/31/2021   Snoring 12/31/2021   Type 2 diabetes mellitus (HCC) 12/31/2021   Anxiety 03/03/2021   Obesity, unspecified 03/03/2021     MEDICAL/FAMILY/SOCIAL HX: No LMP recorded.    Past Medical History:  Diagnosis Date   Hypertension     No past surgical history on file.  Family History  Problem Relation Age of Onset   Healthy Mother    Healthy Father     Social History:  reports  that she has never smoked. She has never used smokeless tobacco. She reports current alcohol use. She reports that she does not use drugs.  ALLERGIES/MEDS:  Allergies: No Known Allergies  No medications prior to admission.     Review of Systems  Constitutional: Negative.   HENT: Negative.    Eyes: Negative.   Respiratory: Negative.    Gastrointestinal: Negative.   Genitourinary: Negative.   Musculoskeletal: Negative.   Skin: Negative.   Neurological: Negative.   Endo/Heme/Allergies: Negative.   Psychiatric/Behavioral: Negative.      There were no vitals taken for this visit. Gen:  NAD, pleasant and cooperative Cardio:  RRR Pulm:  CTAB, no wheezes/rales/rhonchi Abd:  Soft, non-distended, non-tender throughout, no rebound/guarding Ext:  No bilateral LE edema, no bilateral calf tenderness\ Pelvic: Labia - unremarkable, vagina - pink moist mucosa, no lesions or abnormal discharge, cervix - no discharge or lesions or CMT, adnexa - no masses or tenderness  No results found for this or any previous visit (from the past 24 hour(s)).  No results found.   ASSESSMENT/PLAN: Gloria Hardy is a 46 y.o. 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  - Admit to [facility] Main OR - Admit labs (CBC, T&S, COVID screen per protocol) - Diet:  Per anesthesia - IVF:  Per anesthesia - VTE Prophylaxis:  SCDs - Antibiotics: None - D/C home same-day  Consents: I discussed with the patient that this surgery is performed to look inside the uterus and remove the uterine  lining.  Prior to surgery, the risks and benefits of the surgery, as well as alternative treatments, have been discussed.  The risks include, but are not limited to bleeding, including the need for a blood transfusion, infection, damage to organs and tissues, including uterine perforation, requiring additional surgery, postoperative pain, short-term and  long-term, failure of the procedure to control symptoms, need for hysterectomy to control bleeding, fluid overload, which could create electrolyte abnormalities and the need to stop the procedure before completion, inability to safely complete the procedure, deep vein thrombosis and/or pulmonary embolism, painful intercourse, complications the course of which cannot be predicted or prevented, and death.  The nature of the CKC is to surgically destroy or remove the abnormal areas, and to send a piece of the tissue to the lab for analysis.  The procedure is diagnostic, and usually therapeutic.  This procedure holds risks of infection, bleeding requiring a blood transfusion, incomplete resolution of the abnormality requiring a future procedure or additional follow-up testing, inability to carry a pregnancy (cervical insufficiency), cervical stenosis (narrowing), blood clot (DVT or PE), and ultimately a risk of death.  Patient understands these risks and agrees to proceed with the above named procedure. Patient was consented for blood products.  The patient is aware that bleeding may result in the need for a blood transfusion which includes risk of transmission of HIV (1:2 million), Hepatitis C (1:2 million), and Hepatitis B (1:200 thousand) and transfusion reaction.  Patient voiced understanding of the above risks as well as understanding of indications for blood transfusion.   Steva Ready, DO [phone] (office)

## 2021-12-31 ENCOUNTER — Encounter (HOSPITAL_COMMUNITY): Payer: Self-pay | Admitting: Obstetrics and Gynecology

## 2021-12-31 DIAGNOSIS — F3289 Other specified depressive episodes: Secondary | ICD-10-CM | POA: Insufficient documentation

## 2021-12-31 DIAGNOSIS — R0683 Snoring: Secondary | ICD-10-CM | POA: Insufficient documentation

## 2021-12-31 DIAGNOSIS — R7989 Other specified abnormal findings of blood chemistry: Secondary | ICD-10-CM | POA: Insufficient documentation

## 2021-12-31 DIAGNOSIS — N926 Irregular menstruation, unspecified: Secondary | ICD-10-CM | POA: Insufficient documentation

## 2021-12-31 DIAGNOSIS — R9389 Abnormal findings on diagnostic imaging of other specified body structures: Secondary | ICD-10-CM | POA: Insufficient documentation

## 2021-12-31 DIAGNOSIS — E119 Type 2 diabetes mellitus without complications: Secondary | ICD-10-CM | POA: Insufficient documentation

## 2021-12-31 DIAGNOSIS — N939 Abnormal uterine and vaginal bleeding, unspecified: Secondary | ICD-10-CM | POA: Insufficient documentation

## 2021-12-31 DIAGNOSIS — Z9189 Other specified personal risk factors, not elsewhere classified: Secondary | ICD-10-CM | POA: Insufficient documentation

## 2021-12-31 DIAGNOSIS — G4700 Insomnia, unspecified: Secondary | ICD-10-CM | POA: Insufficient documentation

## 2022-01-01 ENCOUNTER — Encounter (HOSPITAL_COMMUNITY): Payer: Self-pay | Admitting: Obstetrics and Gynecology

## 2022-01-01 NOTE — Progress Notes (Signed)
PCP - Dr Early Osmond Cardiologist - n/a  Chest x-ray - n/a EKG - DOS Stress Test - n/a ECHO - n/a Cardiac Cath - n/a  ICD Pacemaker/Loop - n/a  Sleep Study -  n/a CPAP - none  ERAS: Clear liquids til 9 AM DOS  Do not take metformin  and ozempic on the morning of surgery.  Patient does not check her blood sugar on a regular basis.  Anesthesia review: Yes  STOP now taking any Aspirin (unless otherwise instructed by your surgeon), Aleve, Naproxen, Ibuprofen, Motrin, Advil, Goody's, BC's, all herbal medications, fish oil, and all vitamins.   Coronavirus Screening Do you have any of the following symptoms:  Cough yes/no: No Fever (>100.11F)  yes/no: No Runny nose yes/no: No Sore throat yes/no: No Difficulty breathing/shortness of breath  yes/no: No  Have you traveled in the last 14 days and where? yes/no: No  Patient verbalized understanding of instructions that were given via phone.

## 2022-01-01 NOTE — Progress Notes (Signed)
Anesthesia Chart Review:  Case: [id number] Date/Time: 01/04/22 1145   Procedures:      DILATATION & CURETTAGE/HYSTEROSCOPY WITH MYOSURE - Rep will be here confirmed on 12/29/21 CS     CONIZATION CERVIX WITH BIOPSY   Anesthesia type: Choice   Pre-op diagnosis: Cervical Intraepithelial Neoplasia III   [facility] / [facility]   Surgeons: Steva Ready, DO       DISCUSSION: Patient is a 46 year old female scheduled for the above procedure. Dx: CIN III.  Other history includes never smoker, HTN, DM2, morbid obesity.   Appears she was last evaluated by PCP Dr. Jacqulyn Bath on 12/04/21 with 12/23/21 preoperative EKG. Had liver US 12/23/21 as well for elevated LFTs (ALT 80-90's, AST 70's) which showed non-specific diffuse increased echogenicity often seen with hepatic steatosis. Denied excessive alcohol intake. Reportedly hepatitis panel negative. A1c in March was elevated at 10%. Greggory Keen was not covered by her insurance, so Ozempic was added to her metformin regimen. Plan for repeat A1c next month. Repeat TSH was normal. She notes plans for GYN procedure for CIN3.    She is a same day work-up. Anesthesia team to evaluate on the day of surgery. She will get CBG on arrival with additional labs as indicated. Surgeon has ordered a T&S.    VS: Ht 5\' 4"  (1.626 m)   Wt (!) 147.4 kg   BMI 55.79 kg/m  BP 12/23/21 ([facility]): HR 108, BP 131/85   PROVIDERS: Pahwani, Rinka R, MD is PCP [facility], limited review available in Care Everywhere).    LABS: Labs as indicated on arrival. Last labs seen are from 12/04/21 [facility]) and include a Hepatic Panel, TSH--results show Alb 4.6, ALP 81, ALT 98 (previously 84 10/20/21), AST 79 (previously 75 10/20/21), total Bili 0.7, TSH 3.32. A1c was 10.0 10/20/21.    IMAGES: 10/22/21 Liver 12/23/21 (for elevated LFTs): IMPRESSION: 1. Diffuse increased echogenicity is identified in the liver. This is a nonspecific finding but often seen with hepatic  steatosis. Nonspecific hepatocellular disease can also result in increased echogenicity. 2. No other abnormalities.   EKG: 12/23/21 [facility]): Sinus tachycardia at 108 bpm.  Low voltage in precordial leads.  Poor R wave progression, may be secondary to pulmonary disease, consider old anterior infarct.   CV: N/A   Past Medical History:  Diagnosis Date   Diabetes mellitus without complication (HCC)    type 2   Hypertension     Past Surgical History:  Procedure Laterality Date   LAPAROTOMY     ovarian cyst    MEDICATIONS: No current facility-administered medications for this encounter.    ALPRAZolam (XANAX) 0.25 MG tablet   amLODipine (NORVASC) 10 MG tablet   aspirin EC 81 MG tablet   Biotin 1000 MCG tablet   busPIRone (BUSPAR) 10 MG tablet   metFORMIN (GLUCOPHAGE-XR) 500 MG 24 hr tablet   Multiple Vitamin (MULTIVITAMIN) tablet   Omega-3 Fatty Acids (FISH OIL) 1000 MG CAPS   Semaglutide,0.25 or 0.5MG /DOS, (OZEMPIC, 0.25 OR 0.5 MG/DOSE,) 2 MG/1.5ML SOPN    [facility], PA-C Surgical Short Stay/Anesthesiology [facility] Phone [phone] [facility] Phone [phone] 01/01/2022 3:48 PM

## 2022-01-01 NOTE — Anesthesia Preprocedure Evaluation (Signed)
Anesthesia Evaluation  Patient identified by MRN, date of birth, ID band Patient awake    Reviewed: Allergy & Precautions, NPO status , Patient's Chart, lab work & pertinent test results  Airway Mallampati: III  TM Distance: >3 FB Neck ROM: Full    Dental  (+) Teeth Intact, Dental Advisory Given   Pulmonary neg pulmonary ROS,    breath sounds clear to auscultation       Cardiovascular hypertension, Pt. on medications (-) angina(-) Past MI and (-) CHF  Rhythm:Regular     Neuro/Psych PSYCHIATRIC DISORDERS Anxiety Depression negative neurological ROS     GI/Hepatic negative GI ROS, No results found for: "ALT", "AST", "GGT", "ALKPHOS", "BILITOT"    Endo/Other  diabetes, Type obesity  Renal/GU Lab Results      Component                Value               Date                      CREATININE               0.63                01/04/2022                Musculoskeletal   Abdominal   Peds  Hematology negative hematology ROS (+) Lab Results      Component                Value               Date                      WBC                      7.5                 01/04/2022                HGB                      12.1                01/04/2022                HCT                      37.7                01/04/2022                MCV                      89.8                01/04/2022                PLT                      253                 01/04/2022              Anesthesia Other Findings   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  Lab Results      Component  Value               Date                      PREGTESTUR               NEGATIVE            01/04/2022                                       Anesthesia Physical Anesthesia Plan  ASA: 3  Anesthesia Plan: General   Post-op Pain Management: Toradol IV (intra-op)* and Ofirmev IV (intra-op)*    Induction: Intravenous  PONV Risk Score and Plan: 3 and Ondansetron and Dexamethasone  Airway Management Planned: LMA and Oral ETT  Additional Equipment: None  Intra-op Plan:   Post-operative Plan: Extubation in OR  Informed Consent: I have reviewed the patients History and Physical, chart, labs and discussed the procedure including the risks, benefits and alternatives for the proposed anesthesia with the patient or authorized representative who has indicated his/her understanding and acceptance.     Dental advisory given  Plan Discussed with: CRNA  Anesthesia Plan Comments: (PAT note written 01/01/2022 by Shonna Chock, PA-C. )       Anesthesia Quick Evaluation

## 2022-01-04 ENCOUNTER — Encounter (HOSPITAL_COMMUNITY)
Admission: RE | Disposition: A | Discharge status: Home / Self Care | Payer: Self-pay | Source: Home / Self Care | Attending: Obstetrics and Gynecology

## 2022-01-04 ENCOUNTER — Encounter (HOSPITAL_COMMUNITY): Payer: Self-pay | Admitting: Obstetrics and Gynecology

## 2022-01-04 ENCOUNTER — Ambulatory Visit (HOSPITAL_BASED_OUTPATIENT_CLINIC_OR_DEPARTMENT_OTHER): Payer: 59 | Admitting: Vascular Surgery

## 2022-01-04 ENCOUNTER — Ambulatory Visit (HOSPITAL_COMMUNITY): Payer: 59 | Admitting: Vascular Surgery

## 2022-01-04 ENCOUNTER — Other Ambulatory Visit: Payer: Self-pay

## 2022-01-04 ENCOUNTER — Ambulatory Visit (HOSPITAL_COMMUNITY)
Admission: RE | Admit: 2022-01-04 | Discharge: 2022-01-04 | Disposition: A | Discharge status: Home / Self Care | Payer: 59 | Attending: Obstetrics and Gynecology | Admitting: Obstetrics and Gynecology

## 2022-01-04 DIAGNOSIS — I10 Essential (primary) hypertension: Secondary | ICD-10-CM

## 2022-01-04 DIAGNOSIS — N939 Abnormal uterine and vaginal bleeding, unspecified: Secondary | ICD-10-CM | POA: Diagnosis present

## 2022-01-04 DIAGNOSIS — D069 Carcinoma in situ of cervix, unspecified: Secondary | ICD-10-CM

## 2022-01-04 DIAGNOSIS — F418 Other specified anxiety disorders: Secondary | ICD-10-CM | POA: Diagnosis not present

## 2022-01-04 DIAGNOSIS — D060 Carcinoma in situ of endocervix: Secondary | ICD-10-CM | POA: Diagnosis not present

## 2022-01-04 DIAGNOSIS — N840 Polyp of corpus uteri: Secondary | ICD-10-CM | POA: Insufficient documentation

## 2022-01-04 DIAGNOSIS — E119 Type 2 diabetes mellitus without complications: Secondary | ICD-10-CM | POA: Diagnosis not present

## 2022-01-04 DIAGNOSIS — Z6841 Body Mass Index (BMI) 40.0 and over, adult: Secondary | ICD-10-CM | POA: Diagnosis not present

## 2022-01-04 HISTORY — PX: DILATATION & CURETTAGE/HYSTEROSCOPY WITH MYOSURE: SHX6511

## 2022-01-04 HISTORY — PX: CERVICAL CONIZATION W/BX: SHX1330

## 2022-01-04 HISTORY — DX: Type 2 diabetes mellitus without complications: E11.9

## 2022-01-04 LAB — BASIC METABOLIC PANEL
Anion gap: 10 (ref 5–15)
BUN: 10 mg/dL (ref 6–20)
CO2: 24 mmol/L (ref 22–32)
Calcium: 9.2 mg/dL (ref 8.9–10.3)
Chloride: 105 mmol/L (ref 98–111)
Creatinine, Ser: 0.63 mg/dL (ref 0.44–1.00)
GFR, Estimated: >60 mL/min (ref >60)
Glucose, Bld: 145 mg/dL — ABNORMAL HIGH (ref 70–99)
Potassium: 3.6 mmol/L (ref 3.5–5.1)
Sodium: 139 mmol/L (ref 135–145)

## 2022-01-04 LAB — CBC
HCT: 37.7 % (ref 36.0–46.0)
Hemoglobin: 12.1 g/dL (ref 12.0–15.0)
MCH: 28.8 pg (ref 26.0–34.0)
MCHC: 32.1 g/dL (ref 30.0–36.0)
MCV: 89.8 fL (ref 80.0–100.0)
Platelets: 253 10*3/uL (ref 150–400)
RBC: 4.2 MIL/uL (ref 3.87–5.11)
RDW: 14.2 % (ref 11.5–15.5)
WBC: 7.5 10*3/uL (ref 4.0–10.5)
nRBC: 0 % (ref 0.0–0.2)

## 2022-01-04 LAB — ABO/RH: ABO/RH(D): A POS

## 2022-01-04 LAB — POCT PREGNANCY, URINE: Preg Test, Ur: NEGATIVE

## 2022-01-04 LAB — GLUCOSE, CAPILLARY
Glucose-Capillary: 144 mg/dL — ABNORMAL HIGH (ref 70–99)
Glucose-Capillary: 107 mg/dL — ABNORMAL HIGH (ref 70–99)

## 2022-01-04 LAB — TYPE AND SCREEN
ABO/RH(D): A POS
Antibody Screen: NEGATIVE

## 2022-01-04 SURGERY — DILATATION & CURETTAGE/HYSTEROSCOPY WITH MYOSURE
Anesthesia: General | Site: Uterus

## 2022-01-04 MED ORDER — OXYCODONE HCL 5 MG PO TABS
ORAL_TABLET | ORAL | Status: AC
  Filled 2022-01-04: qty 1

## 2022-01-04 MED ORDER — LIDOCAINE 2% (20 MG/ML) 5 ML SYRINGE
INTRAMUSCULAR | Status: AC
  Filled 2022-01-04: qty 5

## 2022-01-04 MED ORDER — LACTATED RINGERS IV SOLN: INTRAVENOUS | Status: DC

## 2022-01-04 MED ORDER — FENTANYL CITRATE (PF) 100 MCG/2ML IJ SOLN
INTRAMUSCULAR | Status: AC
  Filled 2022-01-04: qty 2

## 2022-01-04 MED ORDER — DEXAMETHASONE SODIUM PHOSPHATE 10 MG/ML IJ SOLN
INTRAMUSCULAR | Status: AC
  Filled 2022-01-04: qty 1

## 2022-01-04 MED ORDER — ACETAMINOPHEN 10 MG/ML IV SOLN: 1000.0000 mg | Freq: Once | INTRAVENOUS | Status: DC | PRN

## 2022-01-04 MED ORDER — ACETIC ACID 5 % SOLN
Status: AC
  Filled 2022-01-04: qty 500

## 2022-01-04 MED ORDER — ACETAMINOPHEN 160 MG/5ML PO SOLN: 1000.0000 mg | Freq: Once | ORAL | Status: DC | PRN

## 2022-01-04 MED ORDER — SUGAMMADEX SODIUM 500 MG/5ML IV SOLN
INTRAVENOUS | Status: AC
  Filled 2022-01-04: qty 5

## 2022-01-04 MED ORDER — LIDOCAINE 2% (20 MG/ML) 5 ML SYRINGE
INTRAMUSCULAR | Status: DC | PRN
  Administered 2022-01-04: 80 mg via INTRAVENOUS

## 2022-01-04 MED ORDER — DEXAMETHASONE SODIUM PHOSPHATE 10 MG/ML IJ SOLN
INTRAMUSCULAR | Status: DC | PRN
  Administered 2022-01-04: 10 mg via INTRAVENOUS

## 2022-01-04 MED ORDER — KETOROLAC TROMETHAMINE 30 MG/ML IJ SOLN
INTRAMUSCULAR | Status: DC | PRN
  Administered 2022-01-04: 30 mg via INTRAVENOUS

## 2022-01-04 MED ORDER — ACETAMINOPHEN 10 MG/ML IV SOLN
INTRAVENOUS | Status: DC | PRN
  Administered 2022-01-04: 1000 mg via INTRAVENOUS

## 2022-01-04 MED ORDER — INSULIN ASPART 100 UNIT/ML IJ SOLN
0.0000 [IU] | INTRAMUSCULAR | Status: DC | PRN
  Administered 2022-01-04: 2 [IU] via SUBCUTANEOUS

## 2022-01-04 MED ORDER — MIDAZOLAM HCL 2 MG/2ML IJ SOLN
INTRAMUSCULAR | Status: AC
Start: 2022-01-04 — End: ?
  Filled 2022-01-04: qty 2

## 2022-01-04 MED ORDER — SILVER NITRATE-POT NITRATE 75-25 % EX MISC
CUTANEOUS | Status: AC
Start: 2022-01-04 — End: ?
  Filled 2022-01-04: qty 10

## 2022-01-04 MED ORDER — PROPOFOL 10 MG/ML IV BOLUS
INTRAVENOUS | Status: DC | PRN
  Administered 2022-01-04: 160 mg via INTRAVENOUS

## 2022-01-04 MED ORDER — OXYCODONE HCL 5 MG/5ML PO SOLN: 5.0000 mg | Freq: Once | ORAL | Status: AC | PRN

## 2022-01-04 MED ORDER — ROCURONIUM BROMIDE 10 MG/ML (PF) SYRINGE
PREFILLED_SYRINGE | INTRAVENOUS | Status: DC | PRN
  Administered 2022-01-04: 80 mg via INTRAVENOUS

## 2022-01-04 MED ORDER — CHLORHEXIDINE GLUCONATE 0.12 % MT SOLN
15.0000 mL | Freq: Once | OROMUCOSAL | Status: AC
  Administered 2022-01-04: 15 mL via OROMUCOSAL
  Filled 2022-01-04: qty 15

## 2022-01-04 MED ORDER — LIDOCAINE HCL (PF) 1 % IJ SOLN
INTRAMUSCULAR | Status: AC
  Filled 2022-01-04: qty 30

## 2022-01-04 MED ORDER — IBUPROFEN 800 MG PO TABS: 800.0000 mg | ORAL_TABLET | Freq: Three times a day (TID) | ORAL | 0 refills | Status: DC | PRN

## 2022-01-04 MED ORDER — IODINE STRONG (LUGOLS) 5 % PO SOLN
ORAL | Status: AC
Start: 2022-01-04 — End: ?
  Filled 2022-01-04: qty 1

## 2022-01-04 MED ORDER — INSULIN ASPART 100 UNIT/ML IJ SOLN
INTRAMUSCULAR | Status: AC
  Filled 2022-01-04: qty 1

## 2022-01-04 MED ORDER — ORAL CARE MOUTH RINSE: 15.0000 mL | Freq: Once | OROMUCOSAL | Status: AC

## 2022-01-04 MED ORDER — FENTANYL CITRATE (PF) 250 MCG/5ML IJ SOLN
INTRAMUSCULAR | Status: DC | PRN
Start: 2022-01-04 — End: 2022-01-04
  Administered 2022-01-04: 100 ug via INTRAVENOUS
  Administered 2022-01-04: 50 ug via INTRAVENOUS

## 2022-01-04 MED ORDER — MIDAZOLAM HCL 2 MG/2ML IJ SOLN
INTRAMUSCULAR | Status: DC | PRN
  Administered 2022-01-04: 2 mg via INTRAVENOUS

## 2022-01-04 MED ORDER — ONDANSETRON HCL 4 MG/2ML IJ SOLN
INTRAMUSCULAR | Status: DC | PRN
  Administered 2022-01-04: 4 mg via INTRAVENOUS

## 2022-01-04 MED ORDER — SODIUM CHLORIDE 0.9 % IR SOLN
Status: DC | PRN
  Administered 2022-01-04: 3000 mL

## 2022-01-04 MED ORDER — FERRIC SUBSULFATE 259 MG/GM EX SOLN
CUTANEOUS | Status: AC
Start: 2022-01-04 — End: ?
  Filled 2022-01-04: qty 8

## 2022-01-04 MED ORDER — OXYCODONE HCL 5 MG PO TABS
5.0000 mg | ORAL_TABLET | Freq: Once | ORAL | Status: AC | PRN
  Administered 2022-01-04: 5 mg via ORAL

## 2022-01-04 MED ORDER — FENTANYL CITRATE (PF) 250 MCG/5ML IJ SOLN
INTRAMUSCULAR | Status: AC
  Filled 2022-01-04: qty 5

## 2022-01-04 MED ORDER — ACETAMINOPHEN 500 MG PO TABS: 1000.0000 mg | ORAL_TABLET | Freq: Once | ORAL | Status: DC | PRN

## 2022-01-04 MED ORDER — LIDOCAINE HCL (PF) 1 % IJ SOLN
INTRAMUSCULAR | Status: DC | PRN
  Administered 2022-01-04: 10 mL

## 2022-01-04 MED ORDER — FENTANYL CITRATE (PF) 100 MCG/2ML IJ SOLN
25.0000 ug | INTRAMUSCULAR | Status: DC | PRN
  Administered 2022-01-04: 50 ug via INTRAVENOUS
  Administered 2022-01-04: 25 ug via INTRAVENOUS

## 2022-01-04 MED ORDER — SUGAMMADEX SODIUM 200 MG/2ML IV SOLN
INTRAVENOUS | Status: DC | PRN
  Administered 2022-01-04: 300 mg via INTRAVENOUS

## 2022-01-04 MED ORDER — ONDANSETRON HCL 4 MG/2ML IJ SOLN
INTRAMUSCULAR | Status: AC
  Filled 2022-01-04: qty 2

## 2022-01-04 SURGICAL SUPPLY — 30 items
BLADE SURG 11 STRL SS (BLADE) ×3 IMPLANT
CANISTER SUCT 3000ML PPV (MISCELLANEOUS) ×3 IMPLANT
DEVICE MYOSURE REACH (MISCELLANEOUS) ×1 IMPLANT
ELECT BALL LEEP 5MM RED (ELECTRODE) ×3 IMPLANT
ELECT BLADE 4.0 EZ CLEAN MEGAD (MISCELLANEOUS) ×3
ELECTRODE BLDE 4.0 EZ CLN MEGD (MISCELLANEOUS) IMPLANT
GLOVE BIO SURGEON STRL SZ 6.5 (GLOVE) ×3 IMPLANT
GLOVE BIOGEL PI IND STRL 6.5 (GLOVE) ×2 IMPLANT
GLOVE BIOGEL PI IND STRL 7.0 (GLOVE) ×2 IMPLANT
GLOVE BIOGEL PI INDICATOR 6.5 (GLOVE) ×1
GLOVE BIOGEL PI INDICATOR 7.0 (GLOVE) ×1
GLOVE SURG ENC TEXT LTX SZ6.5 (GLOVE) ×3 IMPLANT
GLOVE SURG UNDER POLY LF SZ7 (GLOVE) ×3 IMPLANT
GOWN STRL REUS W/ TWL LRG LVL3 (GOWN DISPOSABLE) ×4 IMPLANT
GOWN STRL REUS W/TWL LRG LVL3 (GOWN DISPOSABLE) ×2
HEMOSTAT SURGICEL 2X14 (HEMOSTASIS) ×1 IMPLANT
HEMOSTAT SURGICEL 2X3 (HEMOSTASIS) ×1 IMPLANT
KIT PROCEDURE FLUENT (KITS) ×3 IMPLANT
KIT TURNOVER KIT B (KITS) ×3 IMPLANT
NS IRRIG 1000ML POUR BTL (IV SOLUTION) ×3 IMPLANT
PACK VAGINAL WOMENS (CUSTOM PROCEDURE TRAY) ×1 IMPLANT
PAD OB MATERNITY 4.3X12.25 (PERSONAL CARE ITEMS) ×3 IMPLANT
SCOPETTES 8  STERILE (MISCELLANEOUS) ×1
SCOPETTES 8 STERILE (MISCELLANEOUS) ×2 IMPLANT
SEAL ROD LENS SCOPE MYOSURE (ABLATOR) ×3 IMPLANT
SUT SILK 2 0 SH (SUTURE) ×3 IMPLANT
SUT VIC AB 0 CT1 27 (SUTURE) ×2
SUT VIC AB 0 CT1 27XBRD ANBCTR (SUTURE) ×4 IMPLANT
TOWEL GREEN STERILE FF (TOWEL DISPOSABLE) ×5 IMPLANT
UNDERPAD 30X36 HEAVY ABSORB (UNDERPADS AND DIAPERS) ×3 IMPLANT

## 2022-01-04 NOTE — Anesthesia Procedure Notes (Signed)
Procedure Name: Intubation Date/Time: 01/04/2022 12:33 PM  Performed by: Dorann Lodge, CRNAPre-anesthesia Checklist: Patient identified, Emergency Drugs available, Suction available and Patient being monitored Patient Re-evaluated:Patient Re-evaluated prior to induction Oxygen Delivery Method: Circle System Utilized Preoxygenation: Pre-oxygenation with 100% oxygen Induction Type: IV induction Ventilation: Mask ventilation without difficulty and Oral airway inserted - appropriate to patient size Laryngoscope Size: Mac and 3 Grade View: Grade II Tube type: Oral Tube size: 7.0 mm Number of attempts: 1 Airway Equipment and Method: Stylet and Oral airway Placement Confirmation: ETT inserted through vocal cords under direct vision, positive ETCO2 and breath sounds checked- equal and bilateral Secured at: 22 cm Tube secured with: Tape Dental Injury: Teeth and Oropharynx as per pre-operative assessment

## 2022-01-04 NOTE — Op Note (Signed)
Pre Op Dx:   1. Abnormal uterine bleeding 2. High grade cervical dysplasia (CIN III)  Post Op Dx:   1. Abnormal uterine bleeding 2. High grade cervical dysplasia (CIN III) 3. Endometrial polyps  Procedure:   1. Hysteroscopy with Dilation and Curettage with Myosure Polypectomy 2. Cold Knife Conization   Surgeon:  Dr. [name] Assistants:  None Anesthesia:  General   EBL:  30cc  IVF:  700cc UOP:  Voied prior to arrival to OR  Fluid Deficit:  350cc   Drains:  None Specimen removed:  Endometrial curettings and endometrial polyps - sent to pathology  Cervical cone biopsy - sent to pathology Device(s) implanted: None Case Type:  Clean-contaminated Findings:  Normal-appearing cervix. Endometrium with multiple polyps noted throughout. Bilateral tubal ostia visualized. Complications: None Indications:  46 y.o. G4P3 with AUB and CIN III noted on ECC.  Description of each procedure:  After informed consent was obtained the patient was taken to the operating room in the dorsal supine position.  After administration of general anesthesia, the patient was placed in the dorsal lithotomy position and prepped and draped in the usual sterile fashion.  Her bladder was emptied using an in and out catheter.  A pre-operative time-out was completed.  The anterior lip of the cervix was grasped with a single-tooth tenaculum and the cervix was serially dilated to accommodate the hysteroscope.  The hysteroscope was advanced and the findings as above was noted. The Myosure Reach was used to resect the polyps and sample the endometrium. A sharp banjo curette was used to curettage the endometrium. The single-tooth tenaculum was removed and its sites were made hemostatic.  Adequate hemostasis was noted.    Attention was then turned to perform the cold knife conization. Two 0-Vicryl sutures were placed at 3 and 9 o'clock on the cervix for traction and hemostasis. A circumferential cervical incision was  initiated and a traction suture was placed in the specimen, tagging the specimen at 12 o'clock o'clock.  The cone biopsy was completed sharply with scissors and passed off the field. The inferior portion of the cervix was removed separately as it was not removed with the original specimen.  The bed of the cone biopsy was made hemostatic with electrosurgery. Monsel's was placed for additional hemostasis. A piece of Surgicel was then tied in place over the cone bed for additional hemostasis.  She was returned to dorsal supine position, awakened and extubated having appeared to tolerate the procedure well.  All counts were correct.  She was transferred to PACU in good condition.    Disposition:  PACU  [name], DO

## 2022-01-04 NOTE — Interval H&P Note (Signed)
History and Physical Interval Note:  01/04/2022 12:03 PM  Gloria Hardy  has presented today for surgery, with the diagnosis of Cervical Intraepithelial Neoplasia III.  The various methods of treatment have been discussed with the patient and family. After consideration of risks, benefits and other options for treatment, the patient has consented to  Procedure(s) with comments: [place] (N/A) - Rep will be here confirmed on 12/29/21 CS CONIZATION CERVIX WITH BIOPSY (N/A) as a surgical intervention.  The patient's history has been reviewed, patient examined, no change in status, stable for surgery.  I have reviewed the patient's chart and labs.  Questions were answered to the patient's satisfaction.     [name]

## 2022-01-04 NOTE — Transfer of Care (Signed)
Immediate Anesthesia Transfer of Care Note  Patient: Gloria Hardy  Procedure(s) Performed: DILATATION & CURETTAGE/HYSTEROSCOPY WITH MYOSURE (Uterus) CONIZATION CERVIX WITH BIOPSY (Cervix)  Patient Location: PACU  Anesthesia Type:General  Level of Consciousness: awake and alert   Airway & Oxygen Therapy: Patient Spontanous Breathing and Patient connected to nasal cannula oxygen  Post-op Assessment: Report given to RN and Post -op Vital signs reviewed and stable  Post vital signs: Reviewed and stable  Last Vitals:  Vitals Value Taken Time  BP 152/93 01/04/22 1342  Temp 36.8 C 01/04/22 1342  Pulse 102 01/04/22 1345  Resp 14 01/04/22 1345  SpO2 92 % 01/04/22 1345  Vitals shown include unvalidated device data.  Last Pain:  Vitals:   01/04/22 0941  TempSrc: Oral         Complications: No notable events documented.

## 2022-01-05 ENCOUNTER — Encounter (HOSPITAL_COMMUNITY): Payer: Self-pay | Admitting: Obstetrics and Gynecology

## 2022-01-05 NOTE — Anesthesia Postprocedure Evaluation (Signed)
Anesthesia Post Note  Patient: RHYANNA SORCE  Procedure(s) Performed: DILATATION & CURETTAGE/HYSTEROSCOPY WITH MYOSURE (Uterus) CONIZATION CERVIX WITH BIOPSY (Cervix)     Patient location during evaluation: PACU Anesthesia Type: General Level of consciousness: awake and alert Pain management: pain level controlled Vital Signs Assessment: post-procedure vital signs reviewed and stable Respiratory status: spontaneous breathing, nonlabored ventilation, respiratory function stable and patient connected to nasal cannula oxygen Cardiovascular status: blood pressure returned to baseline and stable Postop Assessment: no apparent nausea or vomiting Anesthetic complications: no   No notable events documented.  Last Vitals:  Vitals:   01/04/22 1357 01/04/22 1412  BP: 137/87 137/82  Pulse: 93 93  Resp: 15 12  Temp:  36.8 C  SpO2: 98% 97%    Last Pain:  Vitals:   01/04/22 1412  TempSrc:   PainSc: 3                  Chanze Teagle

## 2022-01-15 LAB — SURGICAL PATHOLOGY

## 2023-06-17 ENCOUNTER — Encounter: Payer: Self-pay | Admitting: Internal Medicine

## 2023-06-17 ENCOUNTER — Ambulatory Visit (INDEPENDENT_AMBULATORY_CARE_PROVIDER_SITE_OTHER): Payer: Medicaid Other | Admitting: Internal Medicine

## 2023-06-17 VITALS — BP 168/96 | HR 92 | Ht 64.0 in | Wt 325.0 lb

## 2023-06-17 DIAGNOSIS — I152 Hypertension secondary to endocrine disorders: Secondary | ICD-10-CM

## 2023-06-17 DIAGNOSIS — K760 Fatty (change of) liver, not elsewhere classified: Secondary | ICD-10-CM

## 2023-06-17 DIAGNOSIS — Z6841 Body Mass Index (BMI) 40.0 and over, adult: Secondary | ICD-10-CM

## 2023-06-17 DIAGNOSIS — E119 Type 2 diabetes mellitus without complications: Secondary | ICD-10-CM

## 2023-06-17 DIAGNOSIS — E1169 Type 2 diabetes mellitus with other specified complication: Secondary | ICD-10-CM

## 2023-06-17 DIAGNOSIS — R002 Palpitations: Secondary | ICD-10-CM | POA: Diagnosis not present

## 2023-06-17 NOTE — Progress Notes (Unsigned)
Pt no showed.

## 2023-06-18 NOTE — Progress Notes (Unsigned)
This encounter was created in error - please disregard.

## 2023-08-22 NOTE — Progress Notes (Incomplete)
Cardiology Office Note:  .   Date:  08/23/2023  ID:  Ernst Breach, DOB 01/11/1976, MRN [id number] PCP: Ollen Bowl, MD  [facility] Providers Cardiologist:  None {  History of Present Illness: FLOREINE KINGDON is a 48 y.o. female with PMH hypertension, type II diabetes, hepatic steatosis, morbid obesity, depression/anxiety referred for evaluation of palpitations at the request of Dr. Jacqulyn Bath.  Today: Notes from Dr. Jacqulyn Bath reviewed. From note dated 04/04/2023, patient had read about POTS and was concerned she may have this. Symptoms were tachycardia and palpitations as well as headaches, tremors, brain fog, lightheadedness. TSH, CBC normal. BMET largely normal.   Zio monitor performed (results included on referral information under media tab). Personally reviewed. ~12 days of total enrollment. HR 59-182, average 91 bpm overall. Sinus was 59-165, average 91 bpm. 1 4 beat episode of VT at 167 bpm. 5 SVT episodes (brief, longest 20 seconds) with average HR 126 bpm. <1% burden of PACs and PVCs. 22 diaries/events; most of these were sinus rhythm/sinus tach, 4 were sinus with ectopy, 1 was sinus with very brief SVT  Has history of ADHD, on atomoxetine 40 mg daily.  Brings a list of concerns today: -concern that she has hypermobile Lorinda Creed, plans to discuss with Dr. Jacqulyn Bath -concern that she might have POTS. Symptoms include tremors, temperature control, night sweats, headaches, brain fog, air hunger (with normal O2), tachycardia, palpitations, nausea, edema in hands and feet, vertigo, rare syncope, headaches during/after sex, coathanger pain, Raynaud's -has captured tachycardia, biggest jump was from 66 bpm to 150 bpm within 10 min.  -wants to review monitor, discussed today  We discussed dysautonomia at length today. She does not meet criteria for POTS today.  Syncope 1-2 times/month, usually with prodrome. One episode without warning several weeks ago under setting of  severe emotional distress and dehydration. Reviewed [place] DMV recommendations today.  Has whole body pain, discussed goal of exercise conditioning (weight loss is secondary).  Blood pressure is elevated today, but she has not taken her meds yet today. Home Bps are reported as well controlled typically.  ROS:  ROS otherwise negative except as noted. See extensive positive ROS above.  Studies Reviewed: Marland Kitchen    EKG:       Physical Exam:   VS:  Ht 5\' 4"  (1.626 m)   Wt (!) 332 lb 3.2 oz (150.7 kg)   SpO2 98%   BMI 57.02 kg/m    Wt Readings from Last 3 Encounters:  08/23/23 (!) 332 lb 3.2 oz (150.7 kg)  06/17/23 (!) 325 lb (147.4 kg)  01/04/22 (!) 323 lb (146.5 kg)    Orthostatics Lying 142/92, HR 100 Sitting 150/92, HR 105 Standing 158/100, HR 116  GEN: Well nourished, well developed in no acute distress HEENT: Normal, moist mucous membranes NECK: No JVD CARDIAC: regular rhythm, normal S1 and S2, no rubs or gallops. No murmur. VASCULAR: Radial and DP pulses 2+ bilaterally. No carotid bruits RESPIRATORY:  Clear to auscultation without rales, wheezing or rhonchi  ABDOMEN: Soft, non-tender, non-distended MUSCULOSKELETAL:  Ambulates independently SKIN: Warm and dry, no edema NEUROLOGIC:  Alert and oriented x 3. No focal neuro deficits noted. PSYCHIATRIC:  Normal affect    ASSESSMENT AND PLAN: .     Syncope I did discuss the [place] DMV medical guidelines for driving: "it is prudent to recommend that all persons should be free of syncopal episodes for at least six months to be granted the driving privilege." (THE   PHYSICIAN'S GUIDE TO DRIVER MEDICAL EVALUATION, Second Edition, Medical Review Branch, Associate Professor, Division of [employer] of Transportation, July 2004)   CV risk counseling and prevention -recommend heart healthy/Mediterranean diet, with whole grains, fruits, vegetable, fish, lean meats, nuts, and olive oil. Limit  salt. -recommend moderate walking, 3-5 times/week for 30-50 minutes each session. Aim for at least 150 minutes.week. Goal should be pace of 3 miles/hours, or walking 1.5 miles in 30 minutes -recommend avoidance of tobacco products. Avoid excess alcohol. -ASCVD risk score: The ASCVD Risk score (Arnett DK, et al., 2019) failed to calculate for the following reasons:   Cannot find a previous HDL lab   Cannot find a previous total cholesterol lab    Dispo: ***  Signed, Jodelle Red, MD   Jodelle Red, MD, PhD, [facility]  [facility]  [place]  [facility] & Vascular at [facility] at [facility] [address], [address] [place], [place] [address] [phone]

## 2023-08-23 ENCOUNTER — Encounter (HOSPITAL_BASED_OUTPATIENT_CLINIC_OR_DEPARTMENT_OTHER): Payer: Self-pay

## 2023-08-23 ENCOUNTER — Ambulatory Visit (HOSPITAL_BASED_OUTPATIENT_CLINIC_OR_DEPARTMENT_OTHER): Payer: Medicaid Other | Admitting: Cardiology

## 2023-08-23 VITALS — Ht 64.0 in | Wt 332.2 lb

## 2023-08-23 DIAGNOSIS — R002 Palpitations: Secondary | ICD-10-CM | POA: Diagnosis not present

## 2023-08-23 DIAGNOSIS — Z7189 Other specified counseling: Secondary | ICD-10-CM

## 2023-08-23 DIAGNOSIS — R55 Syncope and collapse: Secondary | ICD-10-CM

## 2023-08-23 DIAGNOSIS — E119 Type 2 diabetes mellitus without complications: Secondary | ICD-10-CM

## 2023-08-23 DIAGNOSIS — Z719 Counseling, unspecified: Secondary | ICD-10-CM

## 2023-08-23 DIAGNOSIS — I10 Essential (primary) hypertension: Secondary | ICD-10-CM

## 2023-08-23 DIAGNOSIS — R000 Tachycardia, unspecified: Secondary | ICD-10-CM

## 2023-08-23 DIAGNOSIS — G901 Familial dysautonomia [Riley-Day]: Secondary | ICD-10-CM | POA: Diagnosis not present

## 2023-08-23 DIAGNOSIS — E8881 Metabolic syndrome: Secondary | ICD-10-CM

## 2023-08-23 DIAGNOSIS — E782 Mixed hyperlipidemia: Secondary | ICD-10-CM

## 2023-08-23 NOTE — Patient Instructions (Addendum)
Medication Instructions:  Your physician recommends that you continue on your current medications as directed. Please refer to the Current Medication list given to you today.  *If you need a refill on your cardiac medications before your next appointment, please call your pharmacy*   Follow-Up: At [facility], you and your health needs are our priority.  As part of our continuing mission to provide you with exceptional heart care, we have created designated Provider Care Teams.  These Care Teams include your primary Cardiologist (physician) and Advanced Practice Providers (APPs -  Physician Assistants and Nurse Practitioners) who all work together to provide you with the care you need, when you need it.  We recommend signing up for the patient portal called "MyChart".  Sign up information is provided on this After Visit Summary.  MyChart is used to connect with patients for Virtual Visits (Telemedicine).  Patients are able to view lab/test results, encounter notes, upcoming appointments, etc.  Non-urgent messages can be sent to your provider as well.   To learn more about what you can do with MyChart, go to [email or web address].     Your next appointment:   3 month(s)  Provider:   Jodelle Red, MD    Other Instructions  -avoid dehydration. Often it requires high volumes of fluids, often with salt/electrolytes included, to stay hydrated. People with orthostasis are very sensitive to fluid shifts and dehydration. Oral rehydration is preferred, and routine use of IV fluids is not recommended. -if tolerated, compression stocking can assist with fluid management and prevent pooling in the legs. -slow position changes are recommended -if there is a feeling of severe lightheadedness, like near to passing out, recommend lying on the floor on the back, with legs elevated up on a chair or up against the wall. -the best long term management is gradual exercise conditioning. I  recommend seated exercises such as bike to start, to avoid the risk of falling with lightheadedness. Exercise programs, either through supervised programs like cardiac rehab or through personal programs, should focus on gradually increasing exercise tolerance and conditioning.  Water aerobics is a great options as well due to whole body compression.  This is an exercise designed for POTS but I share with a lot of people: http://peterson-powell.net/  A good resource is kdxobr.com  Look into [employer] for fitted compression stockings: [address] [place], [place], [place] [address] [phone] https://elastictherapy.com/

## 2023-09-04 ENCOUNTER — Encounter (HOSPITAL_BASED_OUTPATIENT_CLINIC_OR_DEPARTMENT_OTHER): Payer: Self-pay | Admitting: Cardiology

## 2023-09-16 ENCOUNTER — Ambulatory Visit: Payer: Medicaid Other | Admitting: Family Medicine

## 2023-09-16 ENCOUNTER — Encounter: Payer: Self-pay | Admitting: Family Medicine

## 2023-09-16 VITALS — BP 138/88 | HR 94 | Ht 64.0 in | Wt 330.0 lb

## 2023-09-16 DIAGNOSIS — M545 Low back pain, unspecified: Secondary | ICD-10-CM

## 2023-09-16 DIAGNOSIS — G8929 Other chronic pain: Secondary | ICD-10-CM | POA: Diagnosis not present

## 2023-09-16 DIAGNOSIS — Q796 Ehlers-Danlos syndrome, unspecified: Secondary | ICD-10-CM | POA: Insufficient documentation

## 2023-09-16 NOTE — Patient Instructions (Addendum)
 Thank you for coming in today.   I've referred you to Physical Therapy.  Let us know if you don't hear from them in one week.   Let me know when you get your ECHO back. We can do a "Familial Aortopathy Genetic Lab" test through [facility] if needed.

## 2023-09-16 NOTE — Progress Notes (Signed)
   I, [name], CMA acting as a scribe for Gloria Graham, MD.  [name] is a 48 y.o. female who presents to [facility] at [facility] today for polyarthralgia and hypermobility.  Today pt reports hypermobility with concerns for EDS Knees, hips, and hands are primary joints affected. Sx also affect the right shoulder. Mother has hx of DVT. Does not know biological father's family history. Scheduled with cardiology today for tilt table test. Has a list of concerns to discuss today.  She notes a chronic history of recurrent joint instability and chronic widespread joint pain.  Pertinent review of systems: No fevers or chills.  Relevant historical information: Being worked up for POTS   Exam:  BP 138/88   Pulse 94   Ht 5\' 4"  (1.626 m)   Wt (!) 330 lb (149.7 kg)   SpO2 98%   BMI 56.64 kg/m  General: Well Developed, well nourished, and in no acute distress.   MSK: Hypermobility with a Beighton score 9/9. EDS evaluation positive for soft velvety skin. Positive for mild skin hyperextensibility. Positive for bilateral Peizogenic papules. 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 She has an echocardiogram scheduled for today.  This will help [place] evaluate for mitral valve prolapse or aortic dilation.  If aortic dilation seen would recommend familial idiopathic a genetic testing which I can arrange for.  She will send me the results of the echocardiogram when they are complete.  As for treatment it is important for her to get a good relationship with a physical therapist who will work with her and listen to her to maintain her goals.  Working on core strength is a great starting point.  Consider aquatic physical therapy if needed.  Referral placed for PT today.  Check back  with me as needed for musculoskeletal exacerbations or pains.  These are likely in probably inevitable with EDS but can be manage.  PDMP not reviewed this encounter. Orders Placed This Encounter  Procedures   Ambulatory referral to Physical Therapy    Referral Priority:   Routine    Referral Type:   Physical Medicine    Referral Reason:   Specialty Services Required    Requested Specialty:   Physical Therapy   No orders of the defined types were placed in this encounter.    Discussed warning signs or symptoms. Please see discharge instructions. Patient expresses understanding.   The above documentation has been reviewed and is accurate and complete Gloria Hardy, M.D.

## 2023-09-20 ENCOUNTER — Encounter: Payer: Self-pay | Admitting: Family Medicine
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
## 2023-09-29 ENCOUNTER — Ambulatory Visit: Payer: Medicaid Other | Attending: Family Medicine | Admitting: Physical Therapy

## 2023-11-18 ENCOUNTER — Ambulatory Visit

## 2023-11-18 ENCOUNTER — Ambulatory Visit: Admitting: Physical Therapy

## 2023-12-05 NOTE — Progress Notes (Incomplete)
 Cardiology Office Note:  .   Date:  12/05/2023  ID:  Kai Orange, DOB February 07, 1976, MRN [id number] PCP: Elester Grim, MD  [facility] Providers Cardiologist:  Sheryle Donning, MD {  History of Present Illness: CARIDEE GOUGE is a 48 y.o. female with PMH hypertension, type II diabetes, hepatic steatosis, morbid obesity, depression/anxiety. I met her 08/23/23. Initial referral with concern for POTS given tachycardia, palpitations. Patient concerned for EDS/POTS, did not meet criteria for POTS at time of initial visit.  CV history: Zio monitor performed (results included on referral information under media tab). Personally reviewed. ~12 days of total enrollment. HR 59-182, average 91 bpm overall. Sinus was 59-165, average 91 bpm. 1 4 beat episode of VT at 167 bpm. 5 SVT episodes (brief, longest 20 seconds) with average HR 126 bpm. <1% burden of PACs and PVCs. 22 diaries/events; most of these were sinus rhythm/sinus tach, 4 were sinus with ectopy, 1 was sinus with very brief SVT  Has history of ADHD, on atomoxetine 40 mg daily.  Today:   ROS:  ROS otherwise negative except as noted. See extensive positive ROS above.  Studies Reviewed: Aaron Aas    EKG:       Physical Exam:   VS:  There were no vitals taken for this visit.   Wt Readings from Last 3 Encounters:  09/16/23 (!) 330 lb (149.7 kg)  08/23/23 (!) 332 lb 3.2 oz (150.7 kg)  06/17/23 (!) 325 lb (147.4 kg)    Orthostatics Lying 142/92, HR 100 Sitting 150/92, HR 105 Standing 158/100, HR 116  GEN: Well nourished, well developed in no acute distress HEENT: Normal, moist mucous membranes NECK: No JVD CARDIAC: regular rhythm, normal S1 and S2, no rubs or gallops. No murmur. VASCULAR: Radial and DP pulses 2+ bilaterally. No carotid bruits RESPIRATORY:  Clear to auscultation without rales, wheezing or rhonchi  ABDOMEN: Soft, non-tender, non-distended MUSCULOSKELETAL:  Ambulates independently SKIN: Warm and  dry, no edema NEUROLOGIC:  Alert and oriented x 3. No focal neuro deficits noted. PSYCHIATRIC:  Normal affect    ASSESSMENT AND PLAN: .    Palpitations Sinus tachycardia Dysautonomia Syncope -I did discuss the [place] DMV medical guidelines for driving: "it is prudent to recommend that all persons should be free of syncopal episodes for at least six months to be granted the driving privilege." (THE [place]  PHYSICIAN'S GUIDE TO DRIVER MEDICAL EVALUATION, Second Edition, Medical Review Branch, Associate Professor, Division of [employer], [place]  Department of Transportation, July 2004)  -we discussed that she does not meet criteria for POTS, but also extensively discussed the spectrum of dysautonomia; reviewed recommendations for management including hydration, compression, increase in cardiovascular activity to increase vagal tone -reviewed that sinus tachycardia is a normal rhythm at a faster rate; reviewed vagal tone as above  Metabolic syndrome, with : Type II diabetes Morbid obesity, BMI 57 Mixed hyperlipidemia Hypertension -we discussed metabolic syndrome at length, increased risk of CV disease, recommendations for management -on Ozempic, discussed GLPs at length, recommend uptitrating as tolerated -on metformin -on aspirin On amlodipine , losartan  CV risk counseling and prevention -recommend heart healthy/Mediterranean diet, with whole grains, fruits, vegetable, fish, lean meats, nuts, and olive oil. Limit salt. -recommend moderate walking, 3-5 times/week for 30-50 minutes each session. Aim for at least 150 minutes.week. Goal should be pace of 3 miles/hours, or walking 1.5 miles in 30 minutes -recommend avoidance of tobacco products. Avoid excess alcohol.  Dispo: 3 mos or sooner  as needed  Multiple complex comorbidities, with extensive discussion as above  Signed, Sheryle Donning, MD   Sheryle Donning, MD, PhD, [facility]  [facility]   [place]  [facility] at [facility] at [facility] [address] [place], [place] [address] [phone]

## 2023-12-09 ENCOUNTER — Encounter (HOSPITAL_BASED_OUTPATIENT_CLINIC_OR_DEPARTMENT_OTHER): Payer: Self-pay

## 2023-12-09 ENCOUNTER — Ambulatory Visit (HOSPITAL_BASED_OUTPATIENT_CLINIC_OR_DEPARTMENT_OTHER): Payer: Medicaid Other | Admitting: Cardiology

## 2024-01-06 ENCOUNTER — Other Ambulatory Visit: Payer: Self-pay | Admitting: Internal Medicine

## 2024-01-06 ENCOUNTER — Telehealth: Payer: Self-pay | Admitting: *Deleted

## 2024-01-06 NOTE — Telephone Encounter (Signed)
 LMOVM requesting call back. Direct number provided.  Will follow-up on feedback about a previous appointment at [facility].

## 2024-01-11 IMAGING — US US ABDOMEN LIMITED
1 series · 14 of 25 positions shown · non-contrast
Comparison: None Available.

CLINICAL DATA: Elevated liver enzymes

EXAM:
ULTRASOUND ABDOMEN LIMITED RIGHT UPPER QUADRANT

[Series 1: us abdomen limited · 0.33mm/px · 14 of 37 slices shown]
[im 1/37]
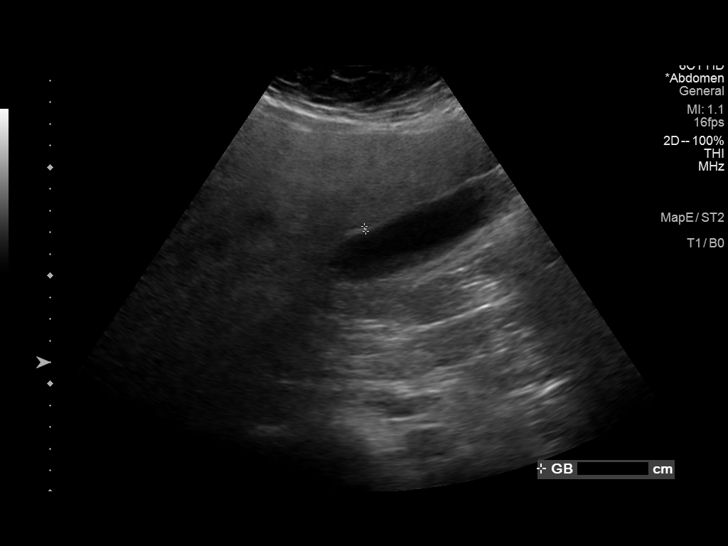
[im 4/37]
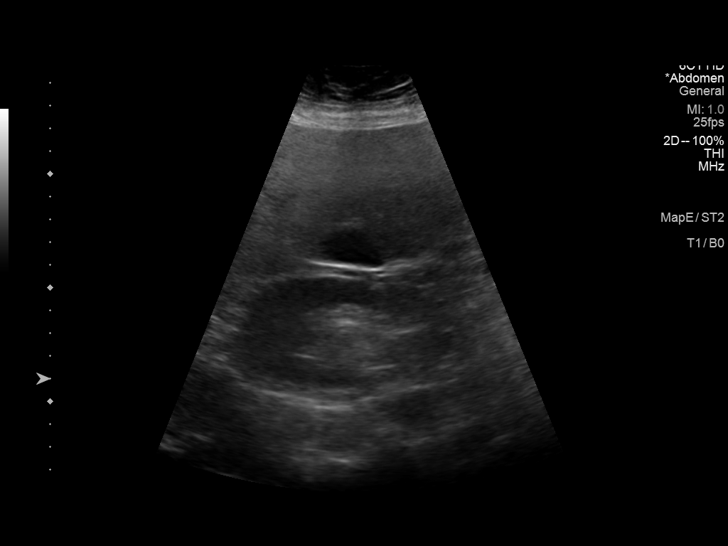
[im 7/37]
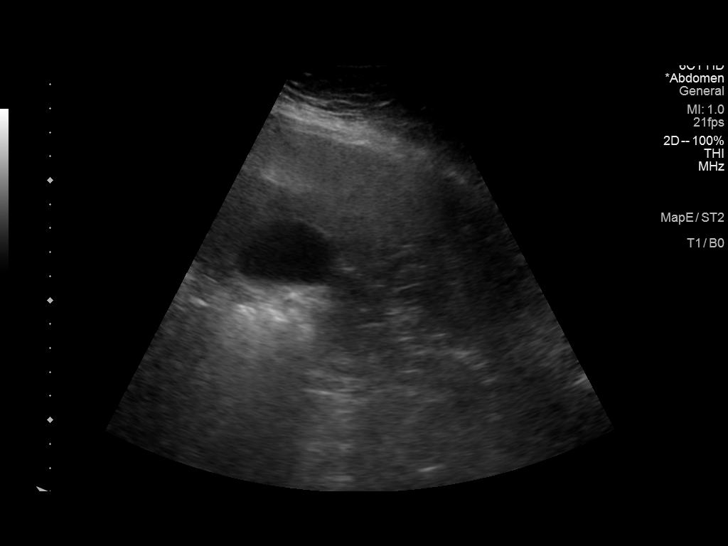
[im 10/37]
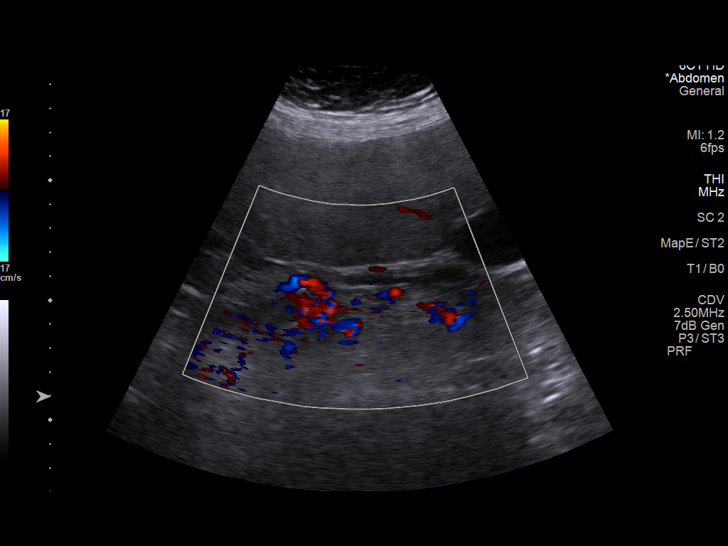
[im 13/37]
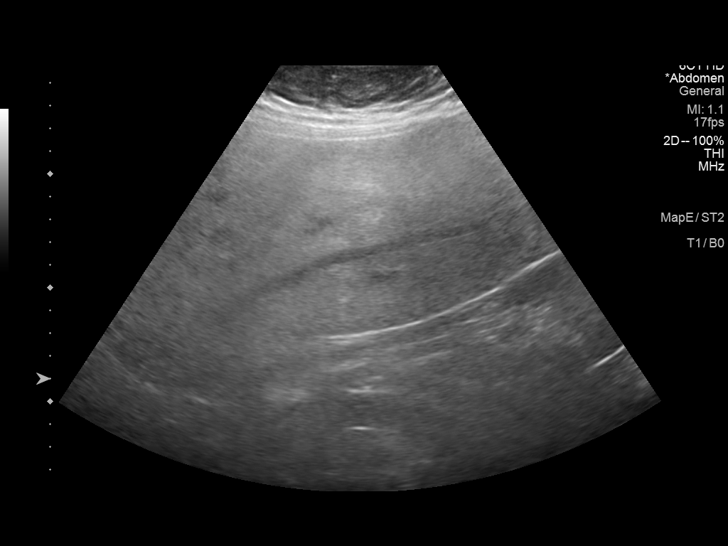
[im 14/37]
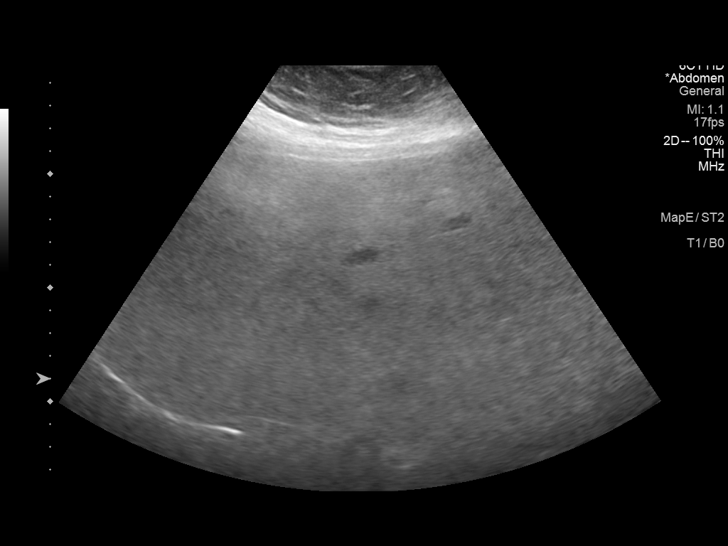
[im 17/37]
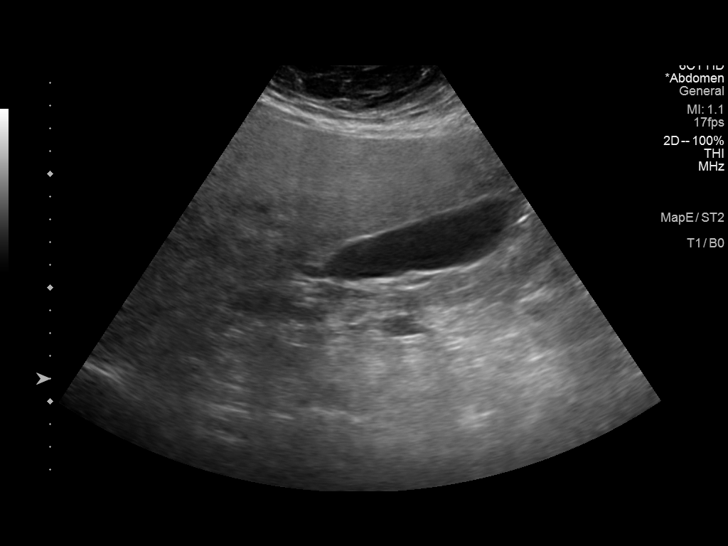
[im 20/37]
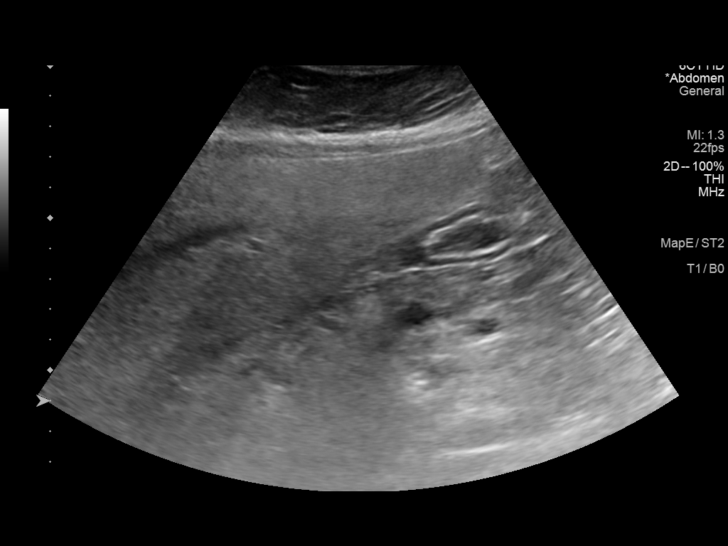
[im 23/37]
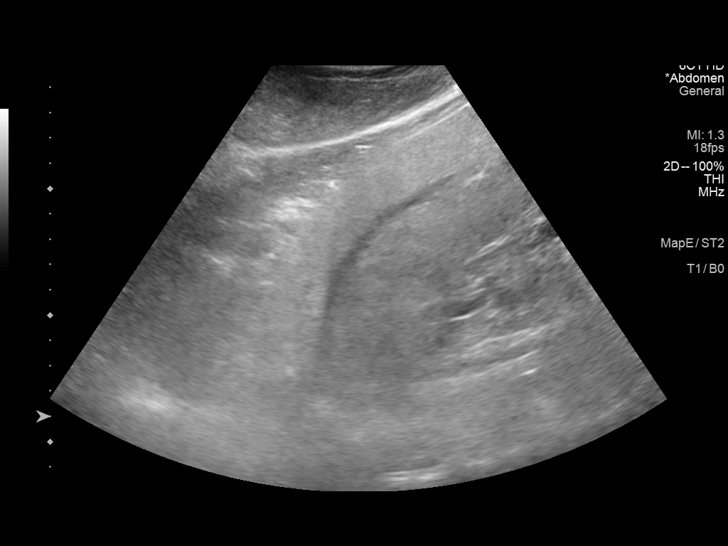
[im 25/37]
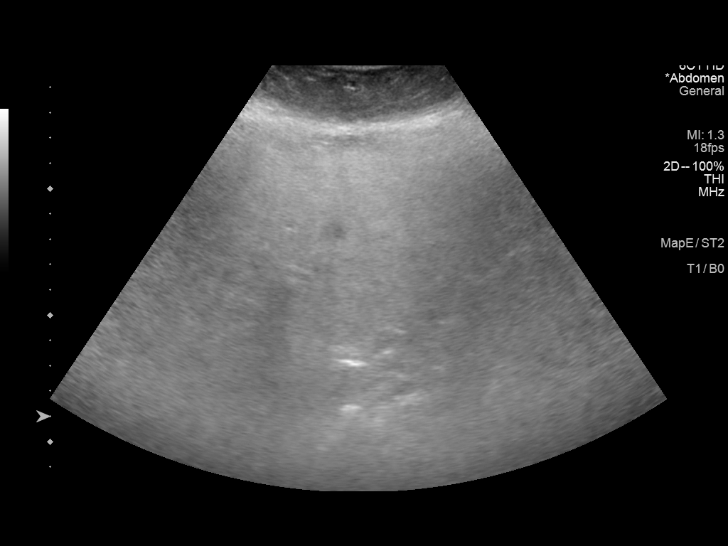
[im 28/37]
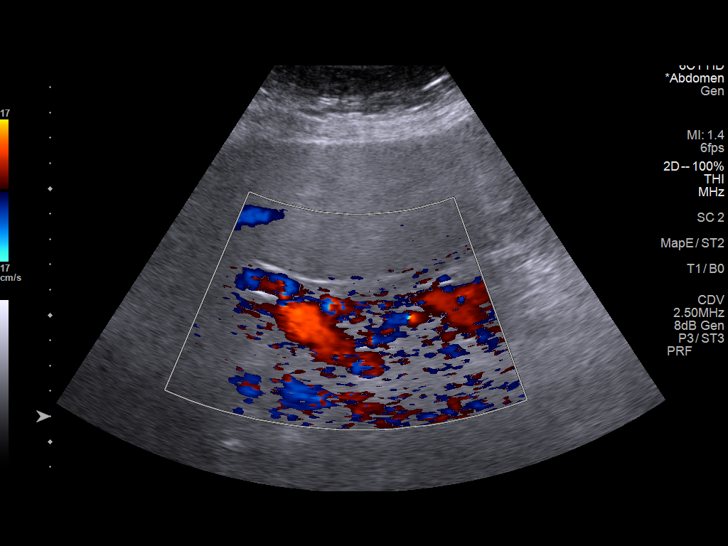
[im 31/37]
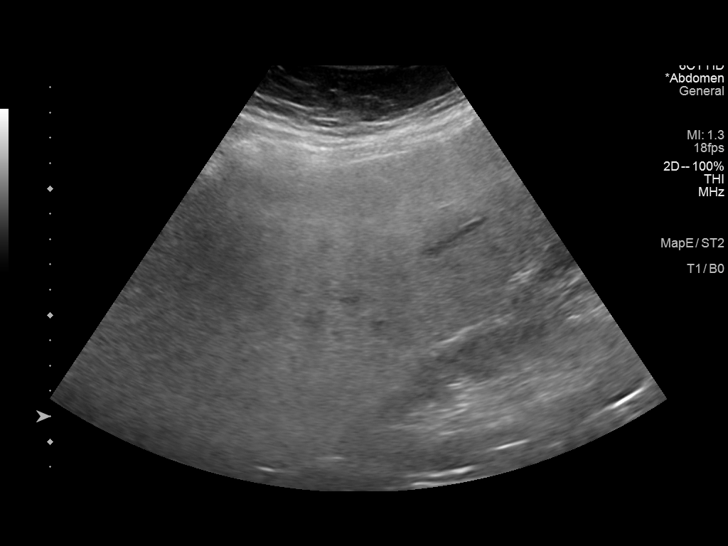
[im 34/37]
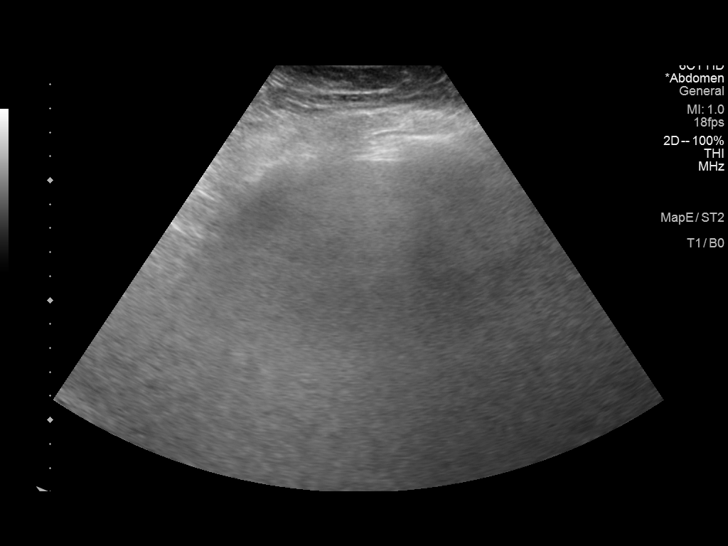
[im 37/37]
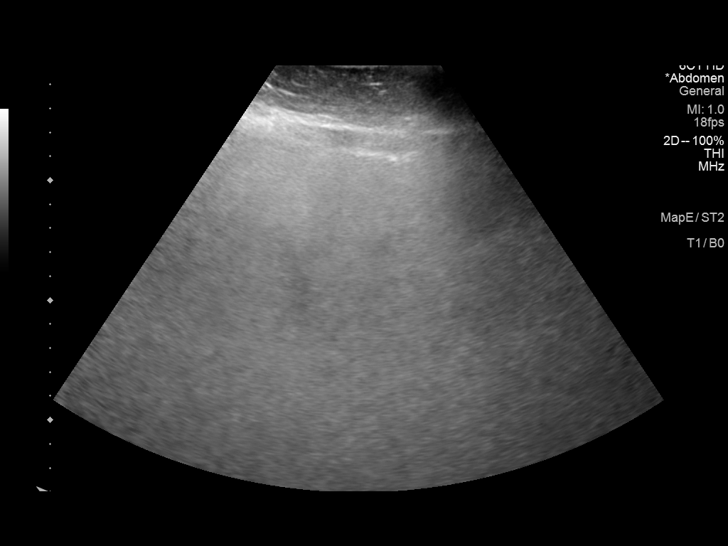

[14 of 25 positions shown; findings below may reference images not displayed]

FINDINGS: Gallbladder:

No gallstones or wall thickening visualized. No sonographic Murphy
sign noted by sonographer.

Common bile duct:

Diameter: 4.4 mm

Liver:

Diffuse increased echogenicity in the liver. No masses. Portal vein
is patent on color Doppler imaging with normal direction of blood
flow towards the liver.

Other: None.
IMPRESSION: 1. Diffuse increased echogenicity is identified in the liver. This
is a nonspecific finding but often seen with hepatic steatosis.
Nonspecific hepatocellular disease can also result in increased
echogenicity.
2. No other abnormalities.

## 2024-01-12 NOTE — Telephone Encounter (Signed)
 Second attempt:  LMOVM requesting call back. Direct number provided.

## 2024-01-24 ENCOUNTER — Encounter (HOSPITAL_COMMUNITY): Payer: Self-pay | Admitting: Internal Medicine

## 2024-01-24 ENCOUNTER — Other Ambulatory Visit: Payer: Self-pay | Admitting: Medical Genetics

## 2024-01-24 NOTE — Progress Notes (Signed)
 Gloria Hardy   PCP-Pahwani MD Cardiologist-Christopher MD, and Isabell MD at [facility]    EKG-08/23/23 Echo-09/16/23 Cath-n/a Stress-n/a ICD/PM-PM GLP1-Ozempic hold 1 week, last dose 6/16 Blood Thinner- n/a  HX: DM, HTN, Ehlers Danlos Syndrome, Hepatic Steatosis, Palpitations. Has been seeing a few cardiologists this year, josephina saw [name] in Jan, where pt was concerned she had POTS but at appt they felt like it was more Dysautonomia. The patient thought it was more than that so saught a diff cardiologist thru [facility]. They ended up doing some more testing, did a TILT test on 6/5. 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 Shes currently looking for a MD that specializes in POTS. I asked if any issues with anes. She said her with her mom they had a hard time waking her up, she only had one issue with dental procedure. Had full mouth extractions about a year ago, they only gave her local but with that her temp increased and BP increased, they had to do some ice and couple breaks but were able to still complete procedure. Hysterectomy in 2023 anesthesia reported no issues. Anesthesia Review- Yes- okay to proceed

## 2024-01-31 ENCOUNTER — Ambulatory Visit (HOSPITAL_COMMUNITY): Admitting: Anesthesiology

## 2024-01-31 ENCOUNTER — Encounter (HOSPITAL_COMMUNITY): Payer: Self-pay | Admitting: Internal Medicine

## 2024-01-31 ENCOUNTER — Ambulatory Visit (HOSPITAL_COMMUNITY)
Admission: RE | Admit: 2024-01-31 | Discharge: 2024-01-31 | Disposition: A | Discharge status: Home / Self Care | Attending: Internal Medicine | Admitting: Internal Medicine

## 2024-01-31 ENCOUNTER — Other Ambulatory Visit: Payer: Self-pay

## 2024-01-31 ENCOUNTER — Encounter (HOSPITAL_COMMUNITY)
Admission: RE | Disposition: A | Discharge status: Home / Self Care | Payer: Self-pay | Source: Home / Self Care | Attending: Internal Medicine

## 2024-01-31 DIAGNOSIS — Z1211 Encounter for screening for malignant neoplasm of colon: Secondary | ICD-10-CM | POA: Diagnosis present

## 2024-01-31 DIAGNOSIS — R7989 Other specified abnormal findings of blood chemistry: Secondary | ICD-10-CM | POA: Insufficient documentation

## 2024-01-31 DIAGNOSIS — I10 Essential (primary) hypertension: Secondary | ICD-10-CM | POA: Insufficient documentation

## 2024-01-31 DIAGNOSIS — E66813 Obesity, class 3: Secondary | ICD-10-CM | POA: Insufficient documentation

## 2024-01-31 DIAGNOSIS — E119 Type 2 diabetes mellitus without complications: Secondary | ICD-10-CM

## 2024-01-31 DIAGNOSIS — Q7962 Hypermobile Ehlers-Danlos syndrome: Secondary | ICD-10-CM | POA: Insufficient documentation

## 2024-01-31 DIAGNOSIS — K5730 Diverticulosis of large intestine without perforation or abscess without bleeding: Secondary | ICD-10-CM | POA: Insufficient documentation

## 2024-01-31 DIAGNOSIS — G90A Postural orthostatic tachycardia syndrome (POTS): Secondary | ICD-10-CM | POA: Insufficient documentation

## 2024-01-31 DIAGNOSIS — Z7984 Long term (current) use of oral hypoglycemic drugs: Secondary | ICD-10-CM | POA: Diagnosis not present

## 2024-01-31 DIAGNOSIS — Z79899 Other long term (current) drug therapy: Secondary | ICD-10-CM | POA: Diagnosis not present

## 2024-01-31 DIAGNOSIS — Z6841 Body Mass Index (BMI) 40.0 and over, adult: Secondary | ICD-10-CM | POA: Diagnosis not present

## 2024-01-31 DIAGNOSIS — F418 Other specified anxiety disorders: Secondary | ICD-10-CM

## 2024-01-31 HISTORY — PX: COLONOSCOPY: SHX5424

## 2024-01-31 LAB — GLUCOSE, CAPILLARY: Glucose-Capillary: 160 mg/dL — ABNORMAL HIGH (ref 70–99)

## 2024-01-31 SURGERY — COLONOSCOPY
Anesthesia: Monitor Anesthesia Care

## 2024-01-31 MED ORDER — PROPOFOL 10 MG/ML IV BOLUS
INTRAVENOUS | Status: DC | PRN
  Administered 2024-01-31: 125 ug/kg/min via INTRAVENOUS
  Administered 2024-01-31: 50 mg via INTRAVENOUS

## 2024-01-31 MED ORDER — KETAMINE HCL 10 MG/ML IJ SOLN
INTRAMUSCULAR | Status: AC
  Filled 2024-01-31: qty 1

## 2024-01-31 MED ORDER — LIDOCAINE 2% (20 MG/ML) 5 ML SYRINGE
INTRAMUSCULAR | Status: DC | PRN
  Administered 2024-01-31: 100 mg via INTRAVENOUS

## 2024-01-31 MED ORDER — SODIUM CHLORIDE 0.9 % IV SOLN: INTRAVENOUS | Status: DC | PRN

## 2024-01-31 MED ORDER — SODIUM CHLORIDE 0.9 % IV SOLN: INTRAVENOUS | Status: DC

## 2024-01-31 MED ORDER — KETAMINE HCL 10 MG/ML IJ SOLN
INTRAMUSCULAR | Status: DC | PRN
  Administered 2024-01-31 (×2): 10 mg via INTRAVENOUS

## 2024-01-31 NOTE — H&P (Signed)
 [name] is a 48 y.o. female who presents for colonoscopy for colorectal cancer screening. No prior colonoscopy. No family history colon cancer. No current alarm symptoms. Did see some blood per rectum with prep, no prior. Has some altered bowel habits. No chest pain or shortness of breath. Has chronic left sided joint pain.    Objective: General: Awake and alert, non-toxic in appearance Cardio: Regular rate and rhythm  Pulm: Clear to auscultation, no conversational dyspnea Abdomen: Soft, non-tender to palpation, bowel sounds appreciated    Assessment:  Colon cancer screening  Plan:  -Recommend colonoscopy, discussed procedure benefits, alternatives and risks of bleeding/infection/perforation/missed lesion/anesthesia, she verbalized understanding and elected to proceed -Further recommendations to follow pending procedure  Gloria Keas, DO [facility]

## 2024-01-31 NOTE — Discharge Instructions (Signed)

## 2024-01-31 NOTE — Op Note (Signed)
 [facility] Patient Name: Gloria Hardy Procedure Date: 01/31/2024 MRN: [id number] Attending MD: Estefana Keas DO, DO, [id number] Date of Birth: 1975-09-22 CSN: [id number] Age: 48 Admit Type: Outpatient Procedure:                Colonoscopy Indications:              Screening for colorectal malignant neoplasm Providers:                Estefana Keas DO, DO, Darleene Bare, RN, Coye Bade, Technician Referring MD:              Medicines:                See the Anesthesia note for documentation of the                            administered medications Complications:            No immediate complications. Estimated Blood Loss:     Estimated blood loss: none. Procedure:                Pre-Anesthesia Assessment:                           - ASA Grade Assessment: III - A patient with severe                            systemic disease.                           - The risks and benefits of the procedure and the                            sedation options and risks were discussed with the                            patient. All questions were answered and informed                            consent was obtained.                           After obtaining informed consent, the colonoscope                            was passed under direct vision. Throughout the                            procedure, the patient's blood pressure, pulse, and                            oxygen saturations were monitored continuously. The                            CF-HQ190L ([id number] Olympus colonoscope was  introduced through the anus and advanced to the the                            terminal ileum, with identification of the                            appendiceal orifice and IC valve. The colonoscopy                            was performed without difficulty. The patient                            tolerated the procedure well. The  quality of the                            bowel preparation was evaluated using the BBPS                            [facility] Bowel Preparation Scale) with scores of:                            Right Colon = 3, Transverse Colon = 3 and Left                            Colon = 3 (entire mucosa seen well with no residual                            staining, small fragments of stool or opaque                            liquid). The total BBPS score equals 9. Scope In: 9:12:25 AM Scope Out: 9:31:58 AM Scope Withdrawal Time: 0 hours 16 minutes 5 seconds  Total Procedure Duration: 0 hours 19 minutes 33 seconds  Findings:      The perianal and digital rectal examinations were normal.      There is no endoscopic evidence of polyps in the entire colon.      A single diverticulum was found in the sigmoid colon.      The terminal ileum appeared normal. Impression:               - Diverticulosis in the sigmoid colon.                           - The examined portion of the ileum was normal.                           - No specimens collected. Moderate Sedation:      See the other procedure note for documentation of moderate sedation with       intraservice time. Recommendation:           - Discharge patient to home.                           - High fiber diet.                           -  Continue present medications.                           - Repeat colonoscopy in 10 years for screening                            purposes.                           - Return to GI office PRN.                           - Telephone GI office if symptomatic PRN. Procedure Code(s):        --- Professional ---                           [phone], Colonoscopy, flexible; diagnostic, including                            collection of specimen(s) by brushing or washing,                            when performed (separate procedure) Diagnosis Code(s):        --- Professional ---                           Z12.11, Encounter for  screening for malignant                            neoplasm of colon                           K57.30, Diverticulosis of large intestine without                            perforation or abscess without bleeding CPT copyright 2022 American Medical Association. All rights reserved. The codes documented in this report are preliminary and upon coder review may  be revised to meet current compliance requirements. Dr Estefana Keas, DO Estefana Keas DO, DO 01/31/2024 9:51:33 AM Number of Addenda: 0

## 2024-01-31 NOTE — Transfer of Care (Signed)
 Immediate Anesthesia Transfer of Care Note  Patient: Gloria Hardy  Procedure(s) Performed: COLONOSCOPY  Patient Location: PACU and Endoscopy Unit  Anesthesia Type:MAC  Level of Consciousness: awake, alert , and oriented  Airway & Oxygen Therapy: Patient Spontanous Breathing and Patient connected to nasal cannula oxygen  Post-op Assessment: Report given to RN and Post -op Vital signs reviewed and stable  Post vital signs: Reviewed and stable  Last Vitals:  Vitals Value Taken Time  BP 177/91 01/31/24 09:40  Temp    Pulse 114 01/31/24 09:41  Resp 21 01/31/24 09:41  SpO2 95 % 01/31/24 09:41  Vitals shown include unfiled device data.  Last Pain:  Vitals:   01/31/24 0820  TempSrc: Temporal  PainSc: 0-No pain         Complications: No notable events documented.

## 2024-01-31 NOTE — Anesthesia Preprocedure Evaluation (Signed)
 Anesthesia Evaluation  Patient identified by MRN, date of birth, ID band Patient awake    Reviewed: Allergy & Precautions, NPO status , Patient's Chart, lab work & pertinent test results  Airway Mallampati: III  TM Distance: >3 FB Neck ROM: Full    Dental  (+) Teeth Intact, Dental Advisory Given   Pulmonary neg pulmonary ROS   breath sounds clear to auscultation       Cardiovascular hypertension, Pt. on medications (-) angina (-) Past MI and (-) CHF  Rhythm:Regular     Neuro/Psych  PSYCHIATRIC DISORDERS Anxiety Depression    negative neurological ROS     GI/Hepatic negative GI ROS,,,No results found for: ALT, AST, GGT, ALKPHOS, BILITOT    Endo/Other  diabetes, Type 2  Class 3 obesity  Renal/GU Lab Results      Component                Value               Date                      CREATININE               0.63                01/04/2022                Musculoskeletal   Abdominal  (+) + obese  Peds  Hematology negative hematology ROS (+) Lab Results      Component                Value               Date                      WBC                      7.5                 01/04/2022                HGB                      12.1                01/04/2022                HCT                      37.7                01/04/2022                MCV                      89.8                01/04/2022                PLT                      253                 01/04/2022              Anesthesia Other Findings   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  Lab  Results      Component                Value               Date                      PREGTESTUR               NEGATIVE            01/04/2022                                         Anesthesia Physical Anesthesia Plan  ASA: 3  Anesthesia Plan: MAC   Post-op Pain Management:    Induction: Intravenous  PONV  Risk Score and Plan: 2 and Propofol  infusion and Treatment may vary due to age or medical condition  Airway Management Planned: Simple Face Mask  Additional Equipment: None  Intra-op Plan:   Post-operative Plan:   Informed Consent: I have reviewed the patients History and Physical, chart, labs and discussed the procedure including the risks, benefits and alternatives for the proposed anesthesia with the patient or authorized representative who has indicated his/her understanding and acceptance.     Dental advisory given  Plan Discussed with: CRNA  Anesthesia Plan Comments: (PAT note written 01/01/2022 by Allison Zelenak, PA-C. )         Anesthesia Quick Evaluation

## 2024-01-31 NOTE — Anesthesia Procedure Notes (Signed)
 Procedure Name: MAC Date/Time: 01/31/2024 9:00 AM  Performed by: Obadiah Reyes BROCKS, CRNAPre-anesthesia Checklist: Patient identified, Emergency Drugs available, Suction available, Patient being monitored and Timeout performed Patient Re-evaluated:Patient Re-evaluated prior to induction Oxygen Delivery Method: Simple face mask Preoxygenation: Pre-oxygenation with 100% oxygen Induction Type: IV induction

## 2024-02-01 ENCOUNTER — Encounter (HOSPITAL_COMMUNITY): Payer: Self-pay | Admitting: Internal Medicine

## 2024-02-01 NOTE — Anesthesia Postprocedure Evaluation (Signed)
 Anesthesia Post Note  Patient: Gloria Hardy  Procedure(s) Performed: COLONOSCOPY     Patient location during evaluation: PACU Anesthesia Type: MAC Level of consciousness: awake and alert Pain management: pain level controlled Vital Signs Assessment: post-procedure vital signs reviewed and stable Respiratory status: spontaneous breathing, nonlabored ventilation and respiratory function stable Cardiovascular status: blood pressure returned to baseline and stable Postop Assessment: no apparent nausea or vomiting Anesthetic complications: no   No notable events documented.  Last Vitals:  Vitals:   01/31/24 0950 01/31/24 1000  BP: (!) 156/79 (!) 162/86  Pulse: (!) 101 (!) 109  Resp: (!) 23 20  Temp:    SpO2: 96% 96%    Last Pain:  Vitals:   01/31/24 1000  TempSrc:   PainSc: 0-No pain                 Butler Levander Pinal

## 2024-05-28 ENCOUNTER — Other Ambulatory Visit: Payer: Self-pay | Admitting: Medical Genetics

## 2024-05-28 DIAGNOSIS — Z006 Encounter for examination for normal comparison and control in clinical research program: Secondary | ICD-10-CM
